# Patient Record
Sex: Female | Born: 1970 | Race: White | Hispanic: No | Marital: Single | Smoking: Never smoker
Health system: Southern US, Community
[De-identification: ages and names within clinical notes are randomized; demographics above are authoritative.]

## PROBLEM LIST (undated history)

## (undated) DIAGNOSIS — K589 Irritable bowel syndrome without diarrhea: Secondary | ICD-10-CM

## (undated) DIAGNOSIS — G4700 Insomnia, unspecified: Secondary | ICD-10-CM

## (undated) DIAGNOSIS — I10 Essential (primary) hypertension: Secondary | ICD-10-CM

## (undated) HISTORY — DX: Essential (primary) hypertension: I10

## (undated) HISTORY — DX: Insomnia, unspecified: G47.00

## (undated) HISTORY — PX: NASAL SEPTUM SURGERY: SHX37

---

## 1998-03-20 ENCOUNTER — Other Ambulatory Visit: Admission: RE | Admit: 1998-03-20 | Discharge: 1998-03-20 | Payer: Self-pay | Admitting: Obstetrics and Gynecology

## 1998-11-04 ENCOUNTER — Other Ambulatory Visit: Admission: RE | Admit: 1998-11-04 | Discharge: 1998-11-04 | Payer: Self-pay | Admitting: Obstetrics and Gynecology

## 1999-06-02 ENCOUNTER — Other Ambulatory Visit: Admission: RE | Admit: 1999-06-02 | Discharge: 1999-06-02 | Payer: Self-pay | Admitting: Obstetrics and Gynecology

## 2000-01-22 ENCOUNTER — Inpatient Hospital Stay (HOSPITAL_COMMUNITY): Admission: AD | Admit: 2000-01-22 | Discharge: 2000-01-25 | Payer: Self-pay | Admitting: Obstetrics and Gynecology

## 2000-03-09 ENCOUNTER — Other Ambulatory Visit: Admission: RE | Admit: 2000-03-09 | Discharge: 2000-03-09 | Payer: Self-pay | Admitting: Obstetrics and Gynecology

## 2002-04-12 ENCOUNTER — Other Ambulatory Visit: Admission: RE | Admit: 2002-04-12 | Discharge: 2002-04-12 | Payer: Self-pay | Admitting: Obstetrics and Gynecology

## 2004-04-09 ENCOUNTER — Other Ambulatory Visit: Admission: RE | Admit: 2004-04-09 | Discharge: 2004-04-09 | Payer: Self-pay | Admitting: Obstetrics and Gynecology

## 2005-05-17 ENCOUNTER — Other Ambulatory Visit: Admission: RE | Admit: 2005-05-17 | Discharge: 2005-05-17 | Payer: Self-pay | Admitting: Obstetrics and Gynecology

## 2005-06-03 ENCOUNTER — Encounter: Admission: RE | Admit: 2005-06-03 | Discharge: 2005-06-03 | Payer: Self-pay | Admitting: Obstetrics and Gynecology

## 2005-06-14 ENCOUNTER — Encounter: Admission: RE | Admit: 2005-06-14 | Discharge: 2005-06-14 | Payer: Self-pay | Admitting: Obstetrics and Gynecology

## 2012-09-18 ENCOUNTER — Encounter: Payer: Self-pay | Admitting: Gastroenterology

## 2012-09-18 ENCOUNTER — Other Ambulatory Visit (HOSPITAL_COMMUNITY): Payer: Self-pay | Admitting: Obstetrics and Gynecology

## 2012-09-18 DIAGNOSIS — K5900 Constipation, unspecified: Secondary | ICD-10-CM

## 2012-09-18 DIAGNOSIS — R1032 Left lower quadrant pain: Secondary | ICD-10-CM

## 2012-09-19 ENCOUNTER — Ambulatory Visit (HOSPITAL_COMMUNITY)
Admission: RE | Admit: 2012-09-19 | Discharge: 2012-09-19 | Disposition: A | Discharge status: Home / Self Care | Payer: Managed Care, Other (non HMO) | Source: Ambulatory Visit | Attending: Obstetrics and Gynecology | Admitting: Obstetrics and Gynecology

## 2012-09-19 DIAGNOSIS — R1032 Left lower quadrant pain: Secondary | ICD-10-CM | POA: Insufficient documentation

## 2012-09-19 DIAGNOSIS — N9489 Other specified conditions associated with female genital organs and menstrual cycle: Secondary | ICD-10-CM | POA: Insufficient documentation

## 2012-09-19 DIAGNOSIS — K5900 Constipation, unspecified: Secondary | ICD-10-CM | POA: Insufficient documentation

## 2012-09-19 MED ORDER — IOHEXOL 300 MG/ML  SOLN
100.0000 mL | Freq: Once | INTRAMUSCULAR | Status: AC | PRN
  Administered 2012-09-19: 100 mL via INTRAVENOUS

## 2012-09-21 ENCOUNTER — Other Ambulatory Visit: Payer: Self-pay | Admitting: Obstetrics and Gynecology

## 2012-09-21 DIAGNOSIS — N858 Other specified noninflammatory disorders of uterus: Secondary | ICD-10-CM

## 2012-09-26 ENCOUNTER — Ambulatory Visit
Admission: RE | Admit: 2012-09-26 | Discharge: 2012-09-26 | Disposition: A | Discharge status: Home / Self Care | Payer: Managed Care, Other (non HMO) | Source: Ambulatory Visit | Attending: Obstetrics and Gynecology | Admitting: Obstetrics and Gynecology

## 2012-09-26 DIAGNOSIS — N858 Other specified noninflammatory disorders of uterus: Secondary | ICD-10-CM

## 2012-09-26 MED ORDER — GADOBENATE DIMEGLUMINE 529 MG/ML IV SOLN
12.0000 mL | Freq: Once | INTRAVENOUS | Status: AC | PRN
  Administered 2012-09-26: 12 mL via INTRAVENOUS

## 2012-10-11 DIAGNOSIS — Q644 Malformation of urachus: Secondary | ICD-10-CM | POA: Insufficient documentation

## 2012-10-11 DIAGNOSIS — R109 Unspecified abdominal pain: Secondary | ICD-10-CM | POA: Insufficient documentation

## 2012-10-13 ENCOUNTER — Ambulatory Visit: Payer: Self-pay | Admitting: Gastroenterology

## 2013-05-28 ENCOUNTER — Encounter: Payer: Self-pay | Admitting: *Deleted

## 2015-07-10 ENCOUNTER — Ambulatory Visit (INDEPENDENT_AMBULATORY_CARE_PROVIDER_SITE_OTHER): Payer: 59

## 2015-07-10 ENCOUNTER — Ambulatory Visit (INDEPENDENT_AMBULATORY_CARE_PROVIDER_SITE_OTHER): Payer: 59 | Admitting: Podiatry

## 2015-07-10 DIAGNOSIS — M779 Enthesopathy, unspecified: Secondary | ICD-10-CM | POA: Diagnosis not present

## 2015-07-10 DIAGNOSIS — S93602A Unspecified sprain of left foot, initial encounter: Secondary | ICD-10-CM

## 2015-07-10 DIAGNOSIS — M79672 Pain in left foot: Secondary | ICD-10-CM | POA: Diagnosis not present

## 2015-07-10 NOTE — Progress Notes (Signed)
   Subjective:    Patient ID: Katrina Molina, female    DOB: 10/10/1970, 45 y.o.   MRN: [id number]  HPI: She presents today with a chief complaint of a injured foot proximally one month ago where she stepped in a hole and fell forward with the foot plantar flexed. She states that she was walking her dog and fell forward on the foot noticing immediate soreness. She states that she has tried rubbing it and massaging it soaking it and icing it and heat and nothing really seems to help she states that over the last few days it has started to fill little better but is still painful and she has a very odd sensation is her between her toes of her left foot.    Review of Systems  All other systems reviewed and are negative.      Objective:   Physical Exam: 45 year old female vital signs stable alert and oriented 3. Pulses are strongly palpable. Neurologic sensorium is intact that she does have some allodynia and some paresthesias of the deep peroneal nerve as it courses across the dorsal aspect of the foot. Deep tendon reflexes are intact muscle strength was 5 over 5 dorsiflexion plantar flexors and inverters everters on physical musculature is intact. Orthopedic evaluation of his wrist all joints distal to the angle full range of motion without crepitation. She has pain on full plane range of motion and separation of the first and second metatarsals. This radiates from dorsal to the plantar arch. Radiographs taken today in the stratus slight diastases between the first and second metatarsals at the bases on possibly indicating a Lisfranc's injury possibly a sprain I don't see a fracture fragment or fleck fracture.       Assessment & Plan:   foot sprain with neuritis dorsal aspect right.  Plan: Discussed etiology pathology conservative versus surgical therapies. At this point I instructed her to take over-the-counter medications such Aleve twice daily for the next 2 weeks on a regular basis. And  to wear good supportive shoes. Also explained to her that this can take as long as 6 months before it feels well.

## 2015-11-29 ENCOUNTER — Ambulatory Visit (HOSPITAL_COMMUNITY)
Admission: EM | Admit: 2015-11-29 | Discharge: 2015-11-29 | Disposition: A | Discharge status: Home / Self Care | Payer: Managed Care, Other (non HMO)

## 2015-11-29 ENCOUNTER — Encounter (HOSPITAL_COMMUNITY): Payer: Self-pay | Admitting: Emergency Medicine

## 2015-11-29 ENCOUNTER — Emergency Department (HOSPITAL_COMMUNITY)
Admission: EM | Admit: 2015-11-29 | Discharge: 2015-11-29 | Disposition: A | Discharge status: Home / Self Care | Payer: 59 | Attending: Emergency Medicine | Admitting: Emergency Medicine

## 2015-11-29 DIAGNOSIS — Z79899 Other long term (current) drug therapy: Secondary | ICD-10-CM | POA: Diagnosis not present

## 2015-11-29 DIAGNOSIS — Y9289 Other specified places as the place of occurrence of the external cause: Secondary | ICD-10-CM | POA: Diagnosis not present

## 2015-11-29 DIAGNOSIS — Y999 Unspecified external cause status: Secondary | ICD-10-CM | POA: Diagnosis not present

## 2015-11-29 DIAGNOSIS — Y93G3 Activity, cooking and baking: Secondary | ICD-10-CM | POA: Insufficient documentation

## 2015-11-29 DIAGNOSIS — S71151A Open bite, right thigh, initial encounter: Secondary | ICD-10-CM

## 2015-11-29 DIAGNOSIS — W5581XA Bitten by other mammals, initial encounter: Secondary | ICD-10-CM | POA: Insufficient documentation

## 2015-11-29 MED ORDER — RABIES VACCINE, PCEC IM SUSR
1.0000 mL | Freq: Once | INTRAMUSCULAR | Status: AC
  Administered 2015-11-29: 1 mL via INTRAMUSCULAR
  Filled 2015-11-29: qty 1

## 2015-11-29 MED ORDER — RABIES IMMUNE GLOBULIN 150 UNIT/ML IM INJ
20.0000 [IU]/kg | INJECTION | Freq: Once | INTRAMUSCULAR | Status: AC
  Administered 2015-11-29: 1350 [IU] via INTRAMUSCULAR
  Filled 2015-11-29: qty 9

## 2015-11-29 NOTE — ED Notes (Signed)
Pt. Stated, I was outside last night and I felt like I got bit by a bat , I didn't see it but Im pretty sure it was a bat . 2 bites on the rt. Side of hip.

## 2015-11-29 NOTE — ED Provider Notes (Signed)
CSN: [id number]     Arrival date & time 11/29/15  1404 History   First MD Initiated Contact with Patient 11/29/15 1757     Chief Complaint  Patient presents with  . Animal Bite    ? bat bite     (Consider location/radiation/quality/duration/timing/severity/associated sxs/prior Treatment) HPI  Pt presenting with c/o bat bite.  Pt states she was outside cooking dinner this evening prior to arrival and there was a bat flying near her head.  She went inside, came outside later to water flowers and felt a bite on her right thigh.  She saw 4 Dinh puncture marks and minimal bleeding at the site  She denies other symptoms.  Bleeding is controlled.  She has not had any other treatment prior to arrival.  There are no other associated systemic symptoms, there are no other alleviating or modifying factors.   Past Medical History  Diagnosis Date  . Insomnia    Past Surgical History  Procedure Laterality Date  . Cesarean section  2001   Family History  Problem Relation Age of Onset  . Hypertension Mother    Social History  Substance Use Topics  . Smoking status: Never Smoker   . Smokeless tobacco: None  . Alcohol Use: Yes   OB History    No data available     Review of Systems  ROS reviewed and all otherwise negative except for mentioned in HPI    Allergies  Aspirin; Ibuprofen; and Penicillins  Home Medications   Prior to Admission medications   Medication Sig Start Date End Date Taking? Authorizing Provider  escitalopram (LEXAPRO) 10 MG tablet Take 10 mg by mouth daily. 06/29/15   Historical Provider, MD  norethindrone-ethinyl estradiol 1/35 (ORTHO-NOVUM 1/35, 28,) tablet Take 1 tablet by mouth daily.    Historical Provider, MD  NORTREL 7/7/7 0.5/0.75/1-35 MG-MCG tablet Take 1 tablet by mouth daily. 06/27/15   Historical Provider, MD  zolpidem (AMBIEN) 10 MG tablet  07/09/15   Historical Provider, MD   BP 184/96 mmHg  Pulse 73  Temp(Src) 97.6 F (36.4 C) (Oral)  Resp 18  Wt  65.772 kg  SpO2 99%  LMP 11/22/2015  Vitals reviewed Physical Exam  Physical Examination: General appearance - alert, well appearing, and in no distress Mental status - alert, oriented to person, place, and time Eyes - no scleral icterus, no conjunctival injection no scleral icterus Chest - clear to auscultation, no wheezes, rales or rhonchi, symmetric air entry Neurological - alert, oriented, normal speech Musculoskeletal - no joint tenderness, deformity or swelling Extremities - peripheral pulses normal, no pedal edema, no clubbing or cyanosis Skin - normal coloration and turgor, no rashes except 4 tiny punctate lesions on right thigh, no active bleeding  ED Course  Procedures (including critical care time) Labs Review Labs Reviewed - No data to display  Imaging Review No results found. I have personally reviewed and evaluated these images and lab results as part of my medical decision-making.   EKG Interpretation None      MDM   Final diagnoses:  Bite of thigh, right, initial encounter    Pt presenting with c/o probable bat bite on right thigh.  Pt administered rabies IG at site of wound and IM, also given rabies vaccine- given information about schedule for completing rabies vaccine.  Discharged with strict return precautions.  Pt agreeable with plan.    Jerelyn Scott, MD 11/29/15 2222

## 2015-11-29 NOTE — Discharge Instructions (Signed)
Return to the ED with any concerns including fever, weakness, seizures, decreased level of alertness/lethargy, or any other alarming symptoms  You should return for the rabies vaccinations as noted on the discharge paperwork

## 2015-12-02 ENCOUNTER — Ambulatory Visit (HOSPITAL_COMMUNITY)
Admission: EM | Admit: 2015-12-02 | Discharge: 2015-12-02 | Disposition: A | Discharge status: Home / Self Care | Payer: 59 | Attending: Internal Medicine | Admitting: Internal Medicine

## 2015-12-02 ENCOUNTER — Encounter (HOSPITAL_COMMUNITY): Payer: Self-pay | Admitting: *Deleted

## 2015-12-02 DIAGNOSIS — Z203 Contact with and (suspected) exposure to rabies: Secondary | ICD-10-CM

## 2015-12-02 MED ORDER — RABIES VACCINE, PCEC IM SUSR
1.0000 mL | Freq: Once | INTRAMUSCULAR | Status: AC
  Administered 2015-12-02: 1 mL via INTRAMUSCULAR

## 2015-12-02 MED ORDER — RABIES VACCINE, PCEC IM SUSR
INTRAMUSCULAR | Status: AC
  Filled 2015-12-02: qty 1

## 2015-12-02 NOTE — ED Triage Notes (Signed)
Pt   Advised  She  Could  Be  Seen today  By  The  Provider   About    Her  bp    She  Declined  And  Promised   She  Would  See  A  pcp

## 2015-12-02 NOTE — Discharge Instructions (Signed)
Followup  As   Directed    And  Return as  Directed  for  The  Next  Injection     Please  Have  Your  bp   followup    As   Directed

## 2015-12-06 ENCOUNTER — Encounter (HOSPITAL_COMMUNITY): Payer: Self-pay | Admitting: Emergency Medicine

## 2015-12-06 ENCOUNTER — Ambulatory Visit (HOSPITAL_COMMUNITY)
Admission: EM | Admit: 2015-12-06 | Discharge: 2015-12-06 | Disposition: A | Discharge status: Home / Self Care | Payer: 59 | Attending: Family Medicine | Admitting: Family Medicine

## 2015-12-06 DIAGNOSIS — Z203 Contact with and (suspected) exposure to rabies: Secondary | ICD-10-CM | POA: Diagnosis not present

## 2015-12-06 MED ORDER — RABIES VACCINE, PCEC IM SUSR
1.0000 mL | Freq: Once | INTRAMUSCULAR | Status: AC
  Administered 2015-12-06: 1 mL via INTRAMUSCULAR

## 2015-12-06 MED ORDER — RABIES VACCINE, PCEC IM SUSR
INTRAMUSCULAR | Status: AC
  Filled 2015-12-06: qty 1

## 2015-12-06 NOTE — Discharge Instructions (Signed)
Return as scheduled.  Return sooner if any concerns

## 2015-12-06 NOTE — ED Triage Notes (Signed)
Patient had contact with a bat.  Day zero was 7/22.  Patient here today for Day seven in rabies series.

## 2015-12-13 ENCOUNTER — Ambulatory Visit (HOSPITAL_COMMUNITY)
Admission: EM | Admit: 2015-12-13 | Discharge: 2015-12-13 | Disposition: A | Discharge status: Home / Self Care | Payer: 59 | Attending: Physician Assistant | Admitting: Physician Assistant

## 2015-12-13 ENCOUNTER — Encounter (HOSPITAL_COMMUNITY): Payer: Self-pay | Admitting: Emergency Medicine

## 2015-12-13 DIAGNOSIS — Z23 Encounter for immunization: Secondary | ICD-10-CM | POA: Diagnosis not present

## 2015-12-13 DIAGNOSIS — Z203 Contact with and (suspected) exposure to rabies: Secondary | ICD-10-CM | POA: Diagnosis not present

## 2015-12-13 MED ORDER — RABIES VACCINE, PCEC IM SUSR
INTRAMUSCULAR | Status: AC
  Filled 2015-12-13: qty 1

## 2015-12-13 MED ORDER — RABIES VACCINE, PCEC IM SUSR
1.0000 mL | Freq: Once | INTRAMUSCULAR | Status: AC
  Administered 2015-12-13: 1 mL via INTRAMUSCULAR

## 2015-12-13 NOTE — Discharge Instructions (Signed)
Today was your final injection of the rabies series. Please return to the Urgent Care Center for any further problems with the vaccinations.

## 2015-12-13 NOTE — ED Triage Notes (Signed)
The patient presented to the [facility] for the final injection in the rabies series that was started at the [facility] ED on 11/29/2015.

## 2016-06-15 ENCOUNTER — Encounter: Payer: Self-pay | Admitting: Physician Assistant

## 2016-06-29 ENCOUNTER — Encounter (INDEPENDENT_AMBULATORY_CARE_PROVIDER_SITE_OTHER): Payer: Self-pay

## 2016-06-29 ENCOUNTER — Encounter: Payer: Self-pay | Admitting: Nurse Practitioner

## 2016-06-29 ENCOUNTER — Ambulatory Visit (INDEPENDENT_AMBULATORY_CARE_PROVIDER_SITE_OTHER): Payer: PRIVATE HEALTH INSURANCE | Admitting: Nurse Practitioner

## 2016-06-29 VITALS — BP 170/100 | HR 80 | Ht 60.0 in | Wt 160.0 lb

## 2016-06-29 DIAGNOSIS — R1032 Left lower quadrant pain: Secondary | ICD-10-CM | POA: Diagnosis not present

## 2016-06-29 DIAGNOSIS — K625 Hemorrhage of anus and rectum: Secondary | ICD-10-CM

## 2016-06-29 MED ORDER — HYOSCYAMINE SULFATE 0.125 MG SL SUBL: 0.1250 mg | SUBLINGUAL_TABLET | Freq: Three times a day (TID) | SUBLINGUAL | 0 refills | Status: DC | PRN

## 2016-06-29 NOTE — Progress Notes (Signed)
HPI:  Patient is a 46 year old female, self-referred, for evaluation of LLQ pain which began in 2014. 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 A follow-up pelvic MRI suggested the same. Patient was referred to urology, she actually saw 2 different urologists and workup was apparently negative. Patient was advised to see GI for evaluation of her abdominal pain. The pain eventually resolved so patient halted her workup. She did however see her GYN and states the evaluation was negative. Though no significant pain she was frequently aware of something bothersome in LLQ when bending, lifting LLE, having intercourse. Several months ago she actually began have episodes of pain in the area which couldn't be related to aforementioned activities. Eating does not consistently cause the pain. Often feels if she could have a bowel movement or pass gas the pain would subside. Pain sometimes radiates through to mid lower back. Heating pad helps the pain.Marland Kitchen. No change in her bowel movements, typically has several bowel movements a day of various consistencies. She has occasional rectal bleeding during times of excessive loose stools.  Past Medical History:  Diagnosis Date  . Insomnia     Past Surgical History:  Procedure Laterality Date  . CESAREAN SECTION  2001   Family History  Problem Relation Age of Onset  . Hypertension Mother   . Heart disease Mother   . Irritable bowel syndrome Mother   . Colon cancer Neg Hx    Social History  Substance Use Topics  . Smoking status: Never Smoker  . Smokeless tobacco: Never Used  . Alcohol use Yes     Comment: 1-2 drinks daily   Current Outpatient Prescriptions  Medication Sig Dispense Refill  . escitalopram (LEXAPRO) 10 MG tablet Take 10 mg by mouth daily.  11  . norethindrone-ethinyl estradiol 1/35 (ORTHO-NOVUM 1/35, 28,) tablet Take 1  tablet by mouth daily.    Marland Kitchen. NORTREL 7/7/7 0.5/0.75/1-35 MG-MCG tablet Take 1 tablet by mouth daily.  1  . zolpidem (AMBIEN) 10 MG tablet      No current facility-administered medications for this visit.    Allergies  Allergen Reactions  . Aspirin   . Ibuprofen   . Penicillins Hives    Review of Systems: All systems reviewed and negative except where noted in HPI.   Physical Exam: BP (!) 170/100   Pulse 80   Ht 5' (1.524 m)   Wt 160 lb (72.6 kg)   BMI 31.25 kg/m  Constitutional:  Well-developed, white female in no acute distress. Psychiatric: Pleasant , normal mood and affect. Behavior is normal. HEENT: Normocephalic and atraumatic. Conjunctivae are normal. No scleral icterus. Neck supple.  Cardiovascular: Normal rate, regular rhythm.  Pulmonary/chest: Effort normal and breath sounds normal. No wheezing, rales or rhonchi. Abdominal: Soft, nondistended, nontender. Bowel sounds active throughout. There are no masses palpable. No hepatomegaly.Negative Carnett's Rectal: no masses felt on DRE Extremities: no edema Lymphadenopathy: No cervical adenopathy noted. Neurological: Alert and oriented to person place and time. Skin: Skin is warm and dry. No rashes noted.   ASSESSMENT AND PLAN:  311. 46 year old female with chronic, mild , intermittent LLQ discomfort consistently associated with activities such as bending, lifting left leg , and sexual intercourse. Pain can radiate through to lower back. Sometimes she has episodes of unprovoked significant pain in the same area. These episodes are transient and she can't reliably relate them to  anything, including eating and defecation.  Patient has several BMs a day of various consistencies but that is her baseline. -She could have musculoskeletal pain. MRI of pelvis for same pain in 2014 showed L5-S1 posterior disc bulge which may be clinically irrelevant to her pain but worth keeping in mind.    -IBS? Trial of bowel antispasmodic prn. If no  benefit she will discontinue -Patient will call with condition update and decision on a colonoscopy in next 3 weeks  2. Occasional scant rectal bleeding during times of excessive loose stools. Most likely perianal source of bleeding but still recommend colonoscopy to exclude other etiologies. She will think about it, not particularly interested right now.    3. Elevated BP - 170/100 in office today  Willette Cluster, NP  06/29/2016, 2:48 PM

## 2016-06-29 NOTE — Patient Instructions (Addendum)
If you are age 45 or older, your body mass index should be between 23-30. Your Body mass index is 31.25 kg/m. If this is out of the aforementioned range listed, please consider follow up with your Primary Care Provider.  If you are age 45 or younger, your body mass index should be between 19-25. Your Body mass index is 31.25 kg/m. If this is out of the aformentioned range listed, please consider follow up with your Primary Care Provider.   We have sent the following medications to your pharmacy for you to pick up at your convenience: Levsin   Call the office in 3-4 weeks with an update. [phone] ask for Beth  Thank you for choosing [facility]

## 2016-07-05 NOTE — Progress Notes (Signed)
Reviewed and agree with documentation and assessment and plan. K. Veena Shlok Raz , MD   

## 2017-02-24 ENCOUNTER — Other Ambulatory Visit: Payer: Self-pay | Admitting: Obstetrics and Gynecology

## 2017-02-24 DIAGNOSIS — N63 Unspecified lump in unspecified breast: Secondary | ICD-10-CM

## 2017-03-14 ENCOUNTER — Other Ambulatory Visit: Payer: 59

## 2017-04-05 ENCOUNTER — Ambulatory Visit
Admission: RE | Admit: 2017-04-05 | Discharge: 2017-04-05 | Disposition: A | Discharge status: Home / Self Care | Payer: PRIVATE HEALTH INSURANCE | Source: Ambulatory Visit | Attending: Obstetrics and Gynecology | Admitting: Obstetrics and Gynecology

## 2017-04-05 DIAGNOSIS — N63 Unspecified lump in unspecified breast: Secondary | ICD-10-CM

## 2018-05-18 ENCOUNTER — Telehealth: Payer: Self-pay | Admitting: Family Medicine

## 2018-05-18 NOTE — Telephone Encounter (Signed)
Patient not seen in office since 08/04/2011 and is no longer our patient and would like to know if you will take her back

## 2018-05-18 NOTE — Telephone Encounter (Signed)
Pt states she was seen at urgent care x 1 month ago for cough and sore throat, pt states her throat feels better but cant seem to subside her cough. Advise.   Patient has not been seen here 08/04/2011. Advise Pt has [employer].

## 2018-05-19 NOTE — Telephone Encounter (Signed)
Sorry not taking on new patients currently

## 2018-05-19 NOTE — Telephone Encounter (Signed)
Left message on answering service to let patient know currently at this time not taking on new patient.

## 2018-06-05 ENCOUNTER — Other Ambulatory Visit: Payer: Self-pay | Admitting: Obstetrics and Gynecology

## 2018-06-05 DIAGNOSIS — R928 Other abnormal and inconclusive findings on diagnostic imaging of breast: Secondary | ICD-10-CM

## 2018-06-09 ENCOUNTER — Ambulatory Visit
Admission: RE | Admit: 2018-06-09 | Discharge: 2018-06-09 | Disposition: A | Discharge status: Home / Self Care | Payer: PRIVATE HEALTH INSURANCE | Source: Ambulatory Visit | Attending: Obstetrics and Gynecology | Admitting: Obstetrics and Gynecology

## 2018-06-09 ENCOUNTER — Ambulatory Visit: Payer: PRIVATE HEALTH INSURANCE

## 2018-06-09 DIAGNOSIS — R928 Other abnormal and inconclusive findings on diagnostic imaging of breast: Secondary | ICD-10-CM

## 2018-11-23 ENCOUNTER — Other Ambulatory Visit: Payer: Self-pay

## 2018-11-23 ENCOUNTER — Encounter (HOSPITAL_COMMUNITY): Payer: Self-pay | Admitting: Emergency Medicine

## 2018-11-23 ENCOUNTER — Emergency Department (HOSPITAL_COMMUNITY)
Admission: EM | Admit: 2018-11-23 | Discharge: 2018-11-23 | Disposition: A | Discharge status: Home / Self Care | Payer: PRIVATE HEALTH INSURANCE | Attending: Emergency Medicine | Admitting: Emergency Medicine

## 2018-11-23 DIAGNOSIS — R040 Epistaxis: Secondary | ICD-10-CM | POA: Insufficient documentation

## 2018-11-23 DIAGNOSIS — Z79899 Other long term (current) drug therapy: Secondary | ICD-10-CM | POA: Insufficient documentation

## 2018-11-23 DIAGNOSIS — I10 Essential (primary) hypertension: Secondary | ICD-10-CM | POA: Insufficient documentation

## 2018-11-23 MED ORDER — AMLODIPINE BESYLATE 5 MG PO TABS
2.5000 mg | ORAL_TABLET | Freq: Every day | ORAL | Status: DC
  Administered 2018-11-23: 07:00:00 2.5 mg via ORAL
  Filled 2018-11-23: qty 1

## 2018-11-23 MED ORDER — AMLODIPINE BESYLATE 2.5 MG PO TABS: 2.5000 mg | ORAL_TABLET | Freq: Every day | ORAL | 3 refills | Status: AC

## 2018-11-23 MED ORDER — PHENYLEPHRINE HCL 0.5 % NA SOLN
1.0000 [drp] | Freq: Once | NASAL | Status: AC
  Administered 2018-11-23: 1 [drp] via NASAL
  Filled 2018-11-23: qty 15

## 2018-11-23 MED ORDER — COCAINE HCL 4 % EX SOLN
4.0000 mL | Freq: Once | CUTANEOUS | Status: AC
  Administered 2018-11-23: 03:00:00 4 mL via NASAL
  Filled 2018-11-23: qty 4

## 2018-11-23 MED ORDER — CLONIDINE HCL 0.2 MG PO TABS
0.2000 mg | ORAL_TABLET | Freq: Once | ORAL | Status: AC
  Administered 2018-11-23: 0.2 mg via ORAL
  Filled 2018-11-23: qty 1

## 2018-11-23 NOTE — ED Provider Notes (Signed)
[facility] EMERGENCY DEPARTMENT Provider Note   CSN: [id number] Arrival date & time: 11/23/18  0251    History   Chief Complaint Chief Complaint  Patient presents with  . Epistaxis    HPI Katrina Molina is a 48 y.o. female.     Patient presents to the emergency department for evaluation of nosebleed.  Patient reports that her nose has been bleeding on and off all day.  She reports that it will bleed for 20 or 30 minutes and then stop.  She denies any direct injury.  She has not had any headache, vision change.  She denies cold symptoms.  She has never had similar problems in the past.     Past Medical History:  Diagnosis Date  . Insomnia     Patient Active Problem List   Diagnosis Date Noted  . Abdominal pain 10/11/2012  . 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 CESAREAN SECTION  2001     OB History   No obstetric history on file.      Home Medications    Prior to Admission medications   Medication Sig Start Date End Date Taking? Authorizing Provider  amLODipine (NORVASC) 2.5 MG tablet Take 1 tablet (2.5 mg total) by mouth daily. 11/23/18   Orpah Greek, MD  escitalopram (LEXAPRO) 10 MG tablet Take 10 mg by mouth daily. 06/29/15   [provider]  hyoscyamine (LEVSIN SL) 0.125 MG SL tablet Place 1 tablet (0.125 mg total) under the tongue every 8 (eight) hours as needed. 06/29/16   Willia Craze, NP  norethindrone-ethinyl estradiol 1/35 ([place] 1/35, 28,) tablet Take 1 tablet by mouth daily.    [provider]  NORTREL 7/7/7 0.5/0.75/1-35 MG-MCG tablet Take 1 tablet by mouth daily. 06/27/15   [provider]  zolpidem (AMBIEN) 10 MG tablet  07/09/15   [provider]    Family History Family History  Problem Relation Age of Onset  . Hypertension Mother   . Heart disease Mother   . Irritable bowel syndrome Mother   . Colon cancer Neg Hx    . Breast cancer Neg Hx     Social History Social History   Tobacco Use  . Smoking status: Never Smoker  . Smokeless tobacco: Never Used  Substance Use Topics  . Alcohol use: Yes    Comment: 1-2 drinks daily  . Drug use: No     Allergies   Aspirin, Ibuprofen, and Penicillins   Review of Systems Review of Systems  HENT: Positive for nosebleeds.   All other systems reviewed and are negative.    Physical Exam Updated Vital Signs BP (!) 167/99   Pulse 79   Temp 98 F (36.7 C) (Oral)   Resp 18   Ht 5' (1.524 m)   Wt 65.8 kg   LMP 11/23/2018 (Exact Date)   SpO2 100%   BMI 28.32 kg/m   Physical Exam Vitals signs and nursing note reviewed.  Constitutional:      General: She is not in acute distress.    Appearance: Normal appearance. She is well-developed.  HENT:     Head: Normocephalic and atraumatic.     Right Ear: Hearing normal.     Left Ear: Hearing normal.     Nose:     Left Nostril: Epistaxis present.  Eyes:     Conjunctiva/sclera: Conjunctivae normal.     Pupils: Pupils are equal, round, and reactive  to light.  Neck:     Musculoskeletal: Normal range of motion and neck supple.  Cardiovascular:     Rate and Rhythm: Regular rhythm.     Heart sounds: S1 normal and S2 normal. No murmur. No friction rub. No gallop.   Pulmonary:     Effort: Pulmonary effort is normal. No respiratory distress.     Breath sounds: Normal breath sounds.  Chest:     Chest wall: No tenderness.  Abdominal:     General: Bowel sounds are normal.     Palpations: Abdomen is soft.     Tenderness: There is no abdominal tenderness. There is no guarding or rebound. Negative signs include Murphy's sign and McBurney's sign.     Hernia: No hernia is present.  Musculoskeletal: Normal range of motion.  Skin:    General: Skin is warm and dry.     Findings: No rash.  Neurological:     Mental Status: She is alert and oriented to person, place, and time.     GCS: GCS eye subscore is 4.  GCS verbal subscore is 5. GCS motor subscore is 6.     Cranial Nerves: No cranial nerve deficit.     Sensory: No sensory deficit.     Coordination: Coordination normal.  Psychiatric:        Speech: Speech normal.        Behavior: Behavior normal.        Thought Content: Thought content normal.      ED Treatments / Results  Labs (all labs ordered are listed, but only abnormal results are displayed) Labs Reviewed - No data to display  EKG None  Radiology No results found.  Procedures Procedures (including critical care time)  Medications Ordered in ED Medications  phenylephrine (NEO-SYNEPHRINE) 0.5 % nasal solution 1 drop (1 drop Left Nare Given 11/23/18 0315)  cocaine 4 % topical solution 4 mL (4 mLs Nasal Given 11/23/18 0317)  cloNIDine (CATAPRES) tablet 0.2 mg (0.2 mg Oral Given 11/23/18 0346)     Initial Impression / Assessment and Plan / ED Course  I have reviewed the triage vital signs and the nursing notes.  Pertinent labs & imaging results that were available during my care of the patient were reviewed by me and considered in my medical decision making (see chart for details).       Patient presents to the emergency department for evaluation of nosebleed.  Patient reports that she has been bleeding on and off all day.  She has never had similar bleeding before.  She is not on any blood thinners.  She reports that the first thing she felt was blood going down the back of her throat.  Since then she has had periods of brisk bleeding where she had heavy bleeding out of the left side of the nose with some coming out of the right side.  Here in the ER she has had only had intermittent slight bleeding.  She has been monitored and evaluated multiple times and I cannot identify an anterior source of the bleeding.  Patient noted to be very hypertensive at arrival.  She reports that she generally has been on the high side when she has seen doctors in the past, but has never done  the follow-up in order to get on any medication or treatment for her high blood pressure.  She was given clonidine here with significant improvement of her blood pressure.  Since the blood pressure has come down, she has not  had any bleeding.  Will continue to monitor blood pressure and for additional bleeding.  Sign out to oncoming ER physician to follow.  If there is any further bleeding, then to fight source.  I suspect that this has been a posterior bleed and therefore will need packing if there is further bleeding.  Discharge with ENT follow-up as well as PCP follow-up for recheck of blood pressure.   Final Clinical Impressions(s) / ED Diagnoses   Final diagnoses:  Left-sided epistaxis  Essential hypertension    ED Discharge Orders         Ordered    amLODipine (NORVASC) 2.5 MG tablet  Daily     11/23/18 0618           Gilda CreasePollina, Kaydn Kumpf J, MD 11/23/18 [phone]

## 2018-11-23 NOTE — Discharge Instructions (Signed)
Please schedule follow-up with your doctor for next week to have your blood pressure rechecked.  Start the blood pressure medication right away.  If you have any further bleeding, return to the ER for repeat evaluation.

## 2018-11-23 NOTE — ED Notes (Signed)
Patient has not had any more active bleeding at this time.

## 2018-11-23 NOTE — ED Triage Notes (Signed)
Patient states that her nose bleed has been off and on since 11am yesterday. Patient states that it has bleed for as long as 30 minutes. Patient's nose is not actively bleeding at this time.

## 2018-12-12 IMAGING — MG 2D DIGITAL DIAGNOSTIC BILATERAL MAMMOGRAM WITH CAD AND ADJUNCT T
8 of 15 series · 8 of 35 positions shown · non-contrast
Comparison: 09/18/2015 and earlier

CLINICAL DATA: Palpable abnormality in the upper portion of the
right breast.

EXAM:
2D DIGITAL DIAGNOSTIC BILATERAL MAMMOGRAM WITH CAD AND ADJUNCT TOMO
ULTRASOUND RIGHT BREAST

[R MLO synth-2D]
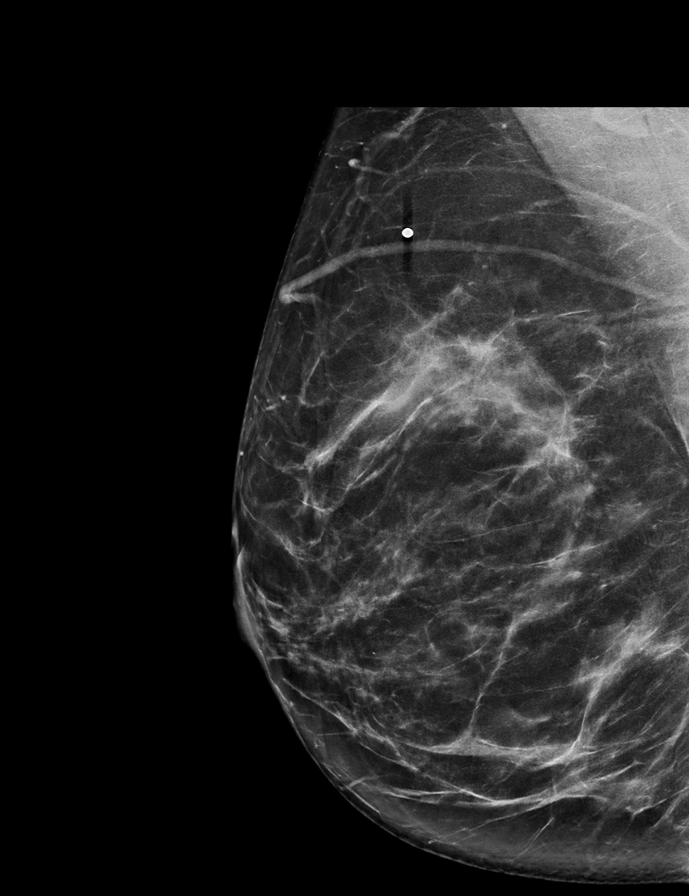

[L MLO]
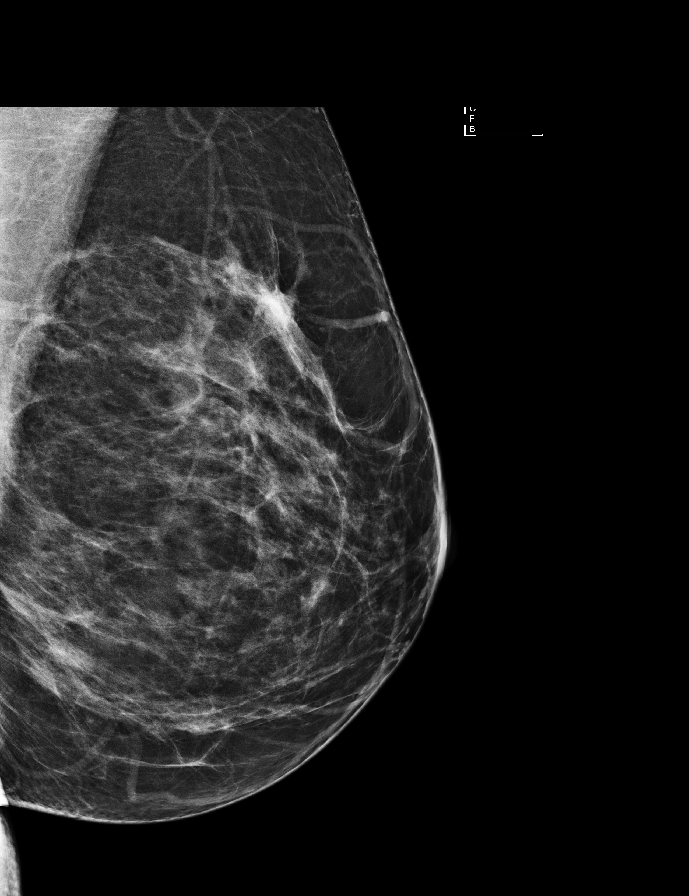

[L CC]
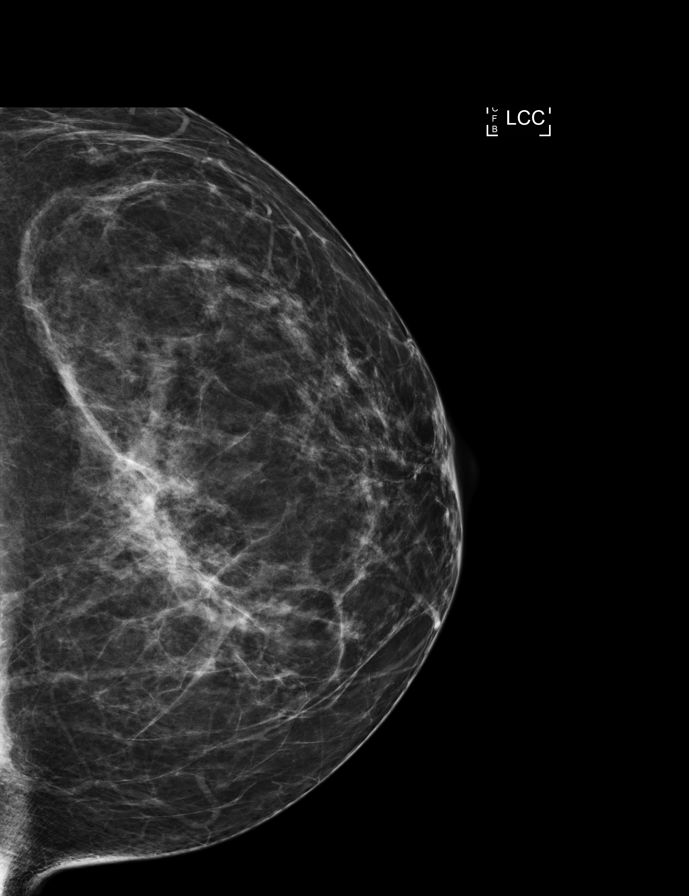

[L MLO synth-2D]
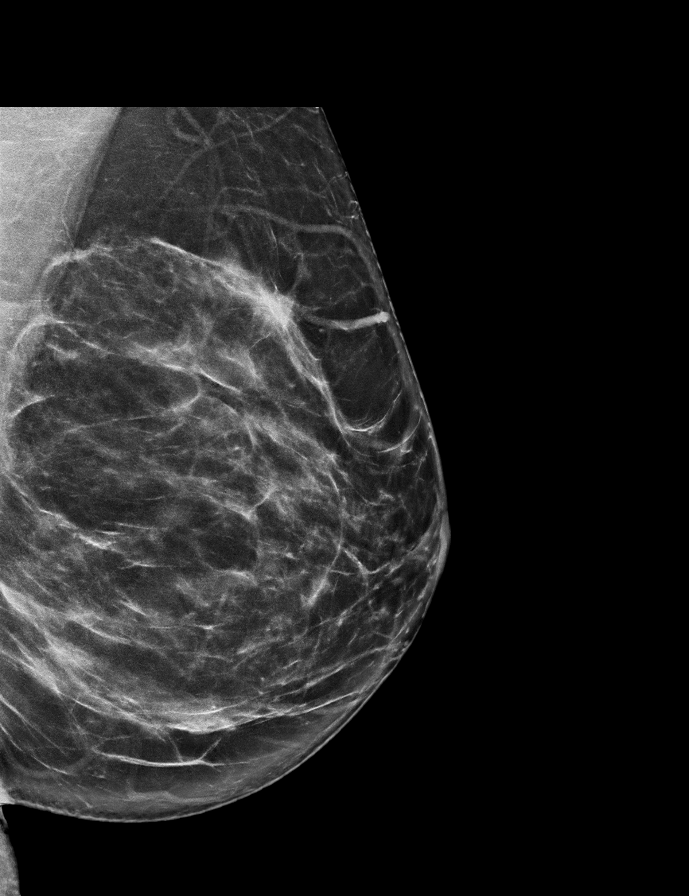

[R TAN synth-2D]
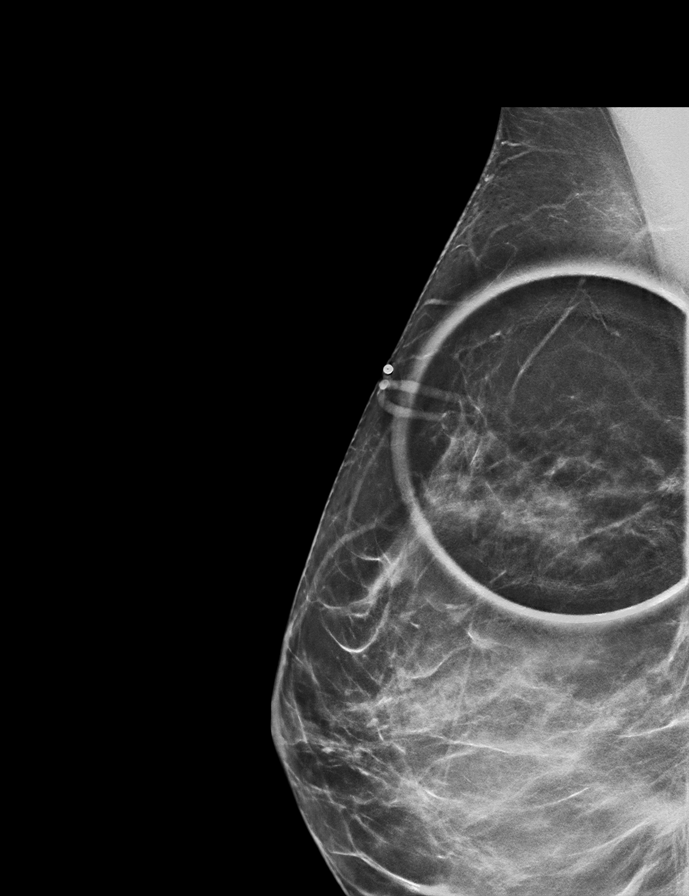

[R TAN]
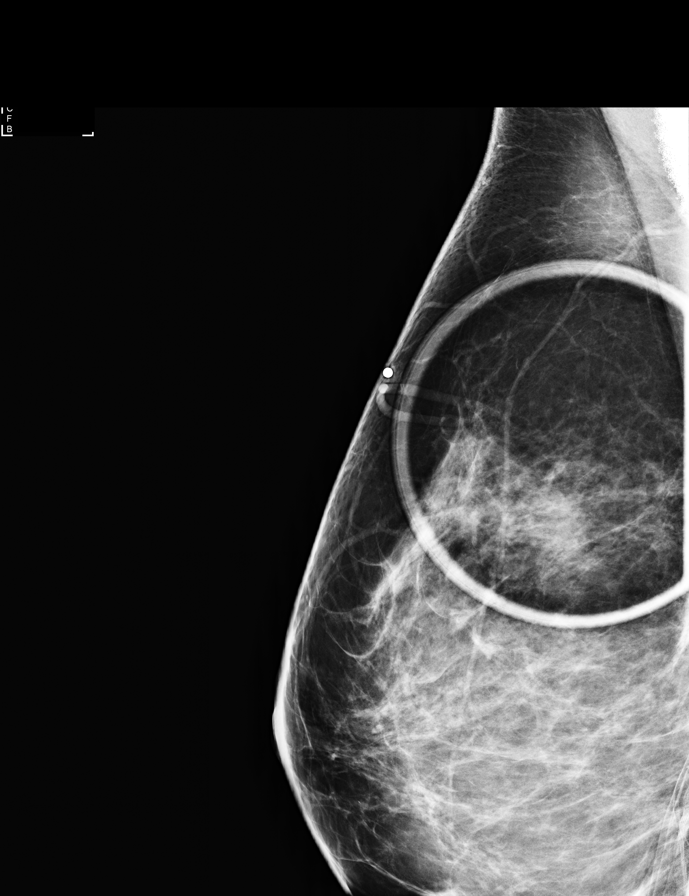

[R MLO]
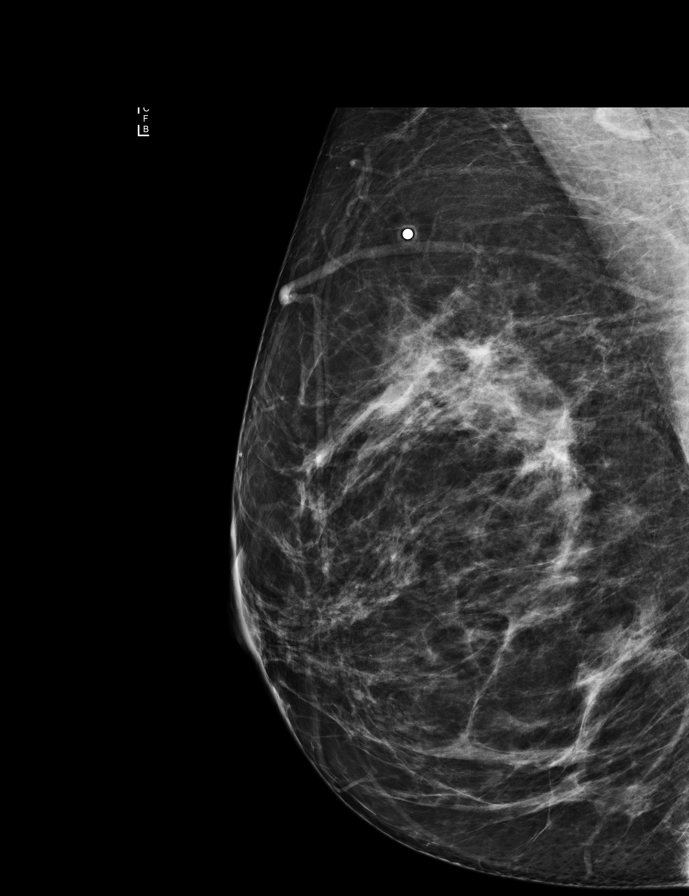

[L CC synth-2D]
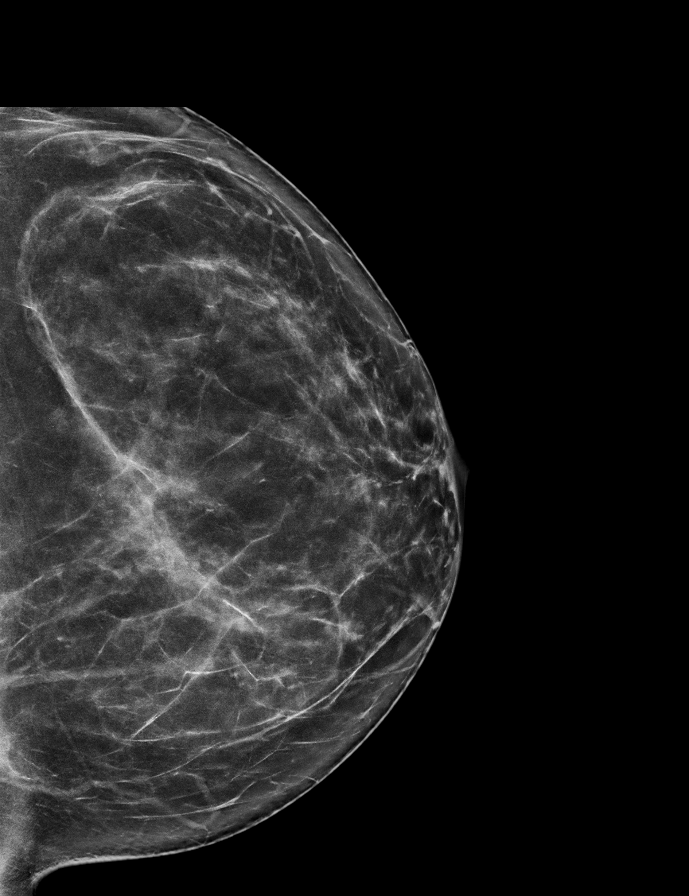

[8 of 35 positions shown; findings below may reference images not displayed]

ACR Breast Density Category b: There are scattered areas of
fibroglandular density.
FINDINGS: No suspicious mass, distortion, or microcalcifications are
identified to suggest presence of malignancy. Spot tangential view
in the area of patient's concern demonstrates normal appearing dense
fibroglandular tissue.

Mammographic images were processed with CAD.

On physical exam, I palpate a soft ridge of thickening across the
upper portion of the right breast, not associated with discrete
mass.

Targeted ultrasound is performed, showing normal appearing
fibroglandular tissue in the upper portion of the right breast. The
ridge of tissue in the 12 o'clock location of the right breast
corresponds well to the area of patient's concern. No mass,
distortion, or acoustic shadowing is demonstrated with ultrasound.
IMPRESSION: No mammographic or ultrasound evidence for malignancy.

RECOMMENDATION:
Screening mammogram in one year.(Code:8H-7-OU7)

I have discussed the findings and recommendations with the patient.
Results were also provided in writing at the conclusion of the
visit. If applicable, a reminder letter will be sent to the patient
regarding the next appointment.

BI-RADS CATEGORY  1: Negative.

## 2020-04-25 ENCOUNTER — Encounter (INDEPENDENT_AMBULATORY_CARE_PROVIDER_SITE_OTHER): Payer: Self-pay | Admitting: Otolaryngology

## 2020-04-25 ENCOUNTER — Other Ambulatory Visit: Payer: Self-pay

## 2020-04-25 ENCOUNTER — Ambulatory Visit (INDEPENDENT_AMBULATORY_CARE_PROVIDER_SITE_OTHER): Payer: 59 | Admitting: Otolaryngology

## 2020-04-25 VITALS — Temp 97.3°F

## 2020-04-25 DIAGNOSIS — J309 Allergic rhinitis, unspecified: Secondary | ICD-10-CM | POA: Diagnosis not present

## 2020-04-25 DIAGNOSIS — J342 Deviated nasal septum: Secondary | ICD-10-CM

## 2020-04-25 DIAGNOSIS — J310 Chronic rhinitis: Secondary | ICD-10-CM

## 2020-04-25 NOTE — Progress Notes (Signed)
HPI: Katrina Molina is a 49 y.o. female who presents for evaluation of right-sided nasal obstruction.  She feels like she might have polyps in her nose.  The right side of her nose always feels closed and she has had increasing right-sided nasal obstruction for several years.  She feels like something is in her nose and feels like she may have polyps in her nose.  She presents here to have her nose examined.  She does not smoke.  She breathes well through the left side of her nose.  Past Medical History:  Diagnosis Date  . Insomnia    Past Surgical History:  Procedure Laterality Date  . CESAREAN SECTION  2001   Social History   Socioeconomic History  . Marital status: Single    Spouse name: Not on file  . Number of children: Not on file  . Years of education: Not on file  . Highest education level: Not on file  Occupational History  . Not on file  Tobacco Use  . Smoking status: Never Smoker  . Smokeless tobacco: Never Used  Substance and Sexual Activity  . Alcohol use: Yes    Comment: 1-2 drinks daily  . Drug use: No  . Sexual activity: Not on file  Other Topics Concern  . Not on file  Social History Narrative  . Not on file   Social Determinants of Health   Financial Resource Strain: Not on file  Food Insecurity: Not on file  Transportation Needs: Not on file  Physical Activity: Not on file  Stress: Not on file  Social Connections: Not on file   Family History  Problem Relation Age of Onset  . Hypertension Mother   . Heart disease Mother   . Irritable bowel syndrome Mother   . Colon cancer Neg Hx   . Breast cancer Neg Hx    Allergies  Allergen Reactions  . Aspirin   . Ibuprofen   . Penicillins Hives   Prior to Admission medications   Medication Sig Start Date End Date Taking? Authorizing Provider  amLODipine (NORVASC) 2.5 MG tablet Take 1 tablet (2.5 mg total) by mouth daily. 11/23/18   Gilda Crease, MD  escitalopram (LEXAPRO) 10 MG tablet  Take 10 mg by mouth daily. 06/29/15   [provider]  hyoscyamine (LEVSIN SL) 0.125 MG SL tablet Place 1 tablet (0.125 mg total) under the tongue every 8 (eight) hours as needed. 06/29/16   Meredith Pel, NP  norethindrone-ethinyl estradiol 1/35 (ORTHO-NOVUM 1/35, 28,) tablet Take 1 tablet by mouth daily.    [provider]  NORTREL 7/7/7 0.5/0.75/1-35 MG-MCG tablet Take 1 tablet by mouth daily. 06/27/15   [provider]  zolpidem (AMBIEN) 10 MG tablet  07/09/15   [provider]     Positive ROS: Otherwise negative  All other systems have been reviewed and were otherwise negative with the exception of those mentioned in the HPI and as above.  Physical Exam: Constitutional: Alert, well-appearing, no acute distress Ears: External ears without lesions or tenderness. Ear canals are clear bilaterally with intact, clear TMs.  Nasal: External nose without lesions. Septum is deviated to the right..  On nasal endoscopy the left nasal cavity is clear.  The left middle meatus region is clear with a patent left maxillary ostia.  There is no edema.  The nasal cavity is otherwise clear.  However on the right side the right middle meatus region is clear but she has a severe deviation of  the right septum posteriorly.  There is no drainage from the middle meatus.  Posterior nasopharynx was clear.  No polyps were noted on either side.  She has mild mucosal swelling but no evidence of infection. Oral: Lips and gums without lesions. Tongue and palate mucosa without lesions. Posterior oropharynx clear. Neck: No palpable adenopathy or masses Respiratory: Breathing comfortably  Skin: No facial/neck lesions or rash noted.  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  No intranasal masses noted.  Plan: Recommended regular use of nasal steroid spray and prescribed Nasacort 2 sprays each nostril at night or at least use Nasacort  regularly on the right side.  Discussed with her that she has no nasal polyps as she is felt like she might have.  Discussed with her that her nasal obstruction secondary to the septal deformity and that she would be a candidate for septoplasty and turbinate reductions if she did not get adequate relief with use of nasal steroid spray.  She will call us back if she decides to pursue septoplasty and turbinate reductions.  She will otherwise follow-up as needed.  Narda Bonds, MD

## 2020-05-22 ENCOUNTER — Other Ambulatory Visit: Payer: Self-pay

## 2020-05-22 ENCOUNTER — Other Ambulatory Visit: Payer: 59

## 2020-05-22 DIAGNOSIS — Z20822 Contact with and (suspected) exposure to covid-19: Secondary | ICD-10-CM

## 2020-05-25 LAB — NOVEL CORONAVIRUS, NAA: SARS-CoV-2, NAA: DETECTED — AB

## 2020-05-26 ENCOUNTER — Telehealth: Payer: Self-pay | Admitting: *Deleted

## 2020-05-26 NOTE — Telephone Encounter (Signed)
Called to discuss with patient about COVID-19 symptoms and the use of one of the available treatments for those with mild to moderate Covid symptoms and at a high risk of hospitalization.  Pt appears to qualify for outpatient treatment due to co-morbid conditions and/or a member of an at-risk group in accordance with the FDA Emergency Use Authorization.    Symptom onset:  Vaccinated:  Booster?  Immunocompromised?  Qualifiers:   Unable to reach pt - No answer.   Eunique Balik Kaye   

## 2020-05-27 ENCOUNTER — Other Ambulatory Visit: Payer: 59

## 2022-05-11 ENCOUNTER — Encounter: Payer: Self-pay | Admitting: Emergency Medicine

## 2022-05-11 ENCOUNTER — Ambulatory Visit
Admission: EM | Admit: 2022-05-11 | Discharge: 2022-05-11 | Disposition: A | Discharge status: Home / Self Care | Payer: 59 | Attending: Urgent Care | Admitting: Urgent Care

## 2022-05-11 DIAGNOSIS — Z23 Encounter for immunization: Secondary | ICD-10-CM | POA: Diagnosis not present

## 2022-05-11 DIAGNOSIS — S51811A Laceration without foreign body of right forearm, initial encounter: Secondary | ICD-10-CM

## 2022-05-11 DIAGNOSIS — M79631 Pain in right forearm: Secondary | ICD-10-CM | POA: Diagnosis not present

## 2022-05-11 MED ORDER — TETANUS-DIPHTH-ACELL PERTUSSIS 5-2.5-18.5 LF-MCG/0.5 IM SUSY
0.5000 mL | PREFILLED_SYRINGE | Freq: Once | INTRAMUSCULAR | Status: AC
  Administered 2022-05-11: 0.5 mL via INTRAMUSCULAR

## 2022-05-11 NOTE — ED Triage Notes (Signed)
[place] off a ladder and fell on a wood pile last night around 8:30pm.  Laceration to right forearm.  Last tetanus about 8 years ago.

## 2022-05-11 NOTE — ED Provider Notes (Signed)
[facility]  Note:  This document was prepared using Dragon voice recognition software and may include unintentional dictation errors.  MRN: [id number] DOB: October 17, 1970  Subjective:   Katrina Molina is a 52 y.o. female presenting for right forearm pain secondary to a right forearm laceration.  Patient fell last night and our clinic was closed.  She accidentally tripped off of a ladder and ended up falling on a wood pile.  Tetanus needs to be updated.  No head injury, loss consciousness.   Current Facility-Administered Medications:    Tdap (BOOSTRIX) injection 0.5 mL, 0.5 mL, Intramuscular, Once, Jaynee Eagles, PA-C  Current Outpatient Medications:    amLODipine (NORVASC) 2.5 MG tablet, Take 1 tablet (2.5 mg total) by mouth daily., Disp: 30 tablet, Rfl: 3   escitalopram (LEXAPRO) 10 MG tablet, Take 10 mg by mouth daily., Disp: , Rfl: 11   hyoscyamine (LEVSIN SL) 0.125 MG SL tablet, Place 1 tablet (0.125 mg total) under the tongue every 8 (eight) hours as needed., Disp: 20 tablet, Rfl: 0   norethindrone-ethinyl estradiol 1/35 (Lamar 1/35, 28,) tablet, Take 1 tablet by mouth daily., Disp: , Rfl:    NORTREL 7/7/7 0.5/0.75/1-35 MG-MCG tablet, Take 1 tablet by mouth daily., Disp: , Rfl: 1   zolpidem (AMBIEN) 10 MG tablet, , Disp: , Rfl:    Allergies  Allergen Reactions   Aspirin    Ibuprofen    Penicillins Hives    Past Medical History:  Diagnosis Date   Insomnia      Past Surgical History:  Procedure Laterality Date   CESAREAN SECTION  2001    Family History  Problem Relation Age of Onset   Hypertension Mother    Heart disease Mother    Irritable bowel syndrome Mother    Colon cancer Neg Hx    Breast cancer Neg Hx     Social History   Tobacco Use   Smoking status: Never   Smokeless tobacco: Never  Substance Use Topics   Alcohol use: Yes    Comment: 1-2 drinks daily   Drug use: No    ROS   Objective:   Vitals: BP (!) 157/82 (BP  Location: Right Arm)   Pulse 82   Temp 98 F (36.7 C) (Oral)   Resp 18   SpO2 97%   Physical Exam Constitutional:      General: She is not in acute distress.    Appearance: Normal appearance. She is well-developed. She is not ill-appearing, toxic-appearing or diaphoretic.  HENT:     Head: Normocephalic and atraumatic.     Nose: Nose normal.     Mouth/Throat:     Mouth: Mucous membranes are moist.  Eyes:     General: No scleral icterus.       Right eye: No discharge.        Left eye: No discharge.     Extraocular Movements: Extraocular movements intact.  Cardiovascular:     Rate and Rhythm: Normal rate.  Pulmonary:     Effort: Pulmonary effort is normal.  Musculoskeletal:       Arms:  Skin:    General: Skin is warm and dry.  Neurological:     General: No focal deficit present.     Mental Status: She is alert and oriented to person, place, and time.  Psychiatric:        Mood and Affect: Mood normal.        Behavior: Behavior normal.    Tdap updated in clinic.  PROCEDURE NOTE: laceration repair Verbal consent obtained from patient.  Local anesthesia with 12cc Lidocaine 2% with epinephrine.  Wound explored for tendon, ligament damage. Wound scrubbed with soap and water and rinsed. Wound closed with 4-0 Prolene (simple interrupted) sutures.  Wound cleansed and dressed.    Assessment and Plan :   PDMP not reviewed this encounter.  1. Right forearm pain   2. Forearm laceration, right, initial encounter   3. Need for diphtheria-tetanus-pertussis (Tdap) vaccine      Laceration repaired successfully. Wound care reviewed. Recommended Tylenol and/or ibuprofen for pain control. Return-to-clinic precautions discussed, patient verbalized understanding. Otherwise, follow up in 10 days for suture removal. Counseled patient on potential for adverse effects with medications prescribed/recommended today, ER and return-to-clinic precautions discussed, patient verbalized  understanding.    Jaynee Eagles, PA-C 05/11/22 1239

## 2022-05-11 NOTE — Discharge Instructions (Signed)

## 2022-05-21 ENCOUNTER — Ambulatory Visit
Admission: RE | Admit: 2022-05-21 | Discharge: 2022-05-21 | Disposition: A | Discharge status: Home / Self Care | Payer: 59 | Source: Ambulatory Visit | Attending: Internal Medicine | Admitting: Internal Medicine

## 2022-05-21 NOTE — ED Triage Notes (Signed)
Here for suture removal from right forearm.

## 2023-05-16 ENCOUNTER — Encounter (INDEPENDENT_AMBULATORY_CARE_PROVIDER_SITE_OTHER): Payer: Self-pay

## 2023-05-16 ENCOUNTER — Ambulatory Visit (INDEPENDENT_AMBULATORY_CARE_PROVIDER_SITE_OTHER): Payer: 59 | Admitting: Otolaryngology

## 2023-05-16 VITALS — BP 159/101 | HR 76 | Ht 60.0 in | Wt 145.0 lb

## 2023-05-16 DIAGNOSIS — J343 Hypertrophy of nasal turbinates: Secondary | ICD-10-CM

## 2023-05-16 DIAGNOSIS — R0981 Nasal congestion: Secondary | ICD-10-CM

## 2023-05-16 DIAGNOSIS — J310 Chronic rhinitis: Secondary | ICD-10-CM | POA: Diagnosis not present

## 2023-05-16 DIAGNOSIS — J342 Deviated nasal septum: Secondary | ICD-10-CM | POA: Insufficient documentation

## 2023-05-16 MED ORDER — MUPIROCIN 2 % EX OINT: 1.0000 | TOPICAL_OINTMENT | Freq: Three times a day (TID) | CUTANEOUS | 3 refills | Status: AC

## 2023-05-16 NOTE — Progress Notes (Signed)
 Patient ID: Katrina Molina, female   DOB: [birth year], 53 y.o.   MRN: [id number]  CC: Chronic nasal obstruction  HPI:  Katrina Molina is a 53 y.o. female who presents today complaining of chronic nasal obstruction for 2+ years.  She was previously diagnosed with nasal septal deviation by Dr. Lonni Angle.  She was treated with Flonase nasal spray and over-the-counter allergy medications.  However, she continues to be symptomatic.  Due to her chronic nasal obstruction, she is a habitual mouth breather.  She has no previous ENT surgery.  Currently she denies any facial pain, fever, or visual change.  She does complain of occasional nasal sores.  She denies any recent sinusitis.  Past Medical History:  Diagnosis Date   Insomnia     Past Surgical History:  Procedure Laterality Date   CESAREAN SECTION  2001    Family History  Problem Relation Age of Onset   Hypertension Mother    Heart disease Mother    Irritable bowel syndrome Mother    Colon cancer Neg Hx    Breast cancer Neg Hx     Social History:  reports that she has never smoked. She has never used smokeless tobacco. She reports current alcohol use. She reports that she does not use drugs.  Allergies:  Allergies  Allergen Reactions   Aspirin    Ibuprofen    Penicillins Hives    Prior to Admission medications   Medication Sig Start Date End Date Taking? Authorizing Provider  amLODipine  (NORVASC ) 2.5 MG tablet Take 1 tablet (2.5 mg total) by mouth daily. 11/23/18  Yes Pollina, Lonni PARAS, MD  escitalopram (LEXAPRO) 10 MG tablet Take 10 mg by mouth daily. 06/29/15  Yes [provider]  hyoscyamine  (LEVSIN  SL) 0.125 MG SL tablet Place 1 tablet (0.125 mg total) under the tongue every 8 (eight) hours as needed. 06/29/16  Yes Kerman Vina HERO, NP  mupirocin  ointment (BACTROBAN ) 2 % Apply 1 Application topically 3 (three) times daily for 7 days. 05/16/23 05/23/23 Yes Karis Clunes, MD  norethindrone-ethinyl  estradiol 1/35 (ORTHO-NOVUM 1/35, 28,) tablet Take 1 tablet by mouth daily.   Yes [provider]  NORTREL 7/7/7 0.5/0.75/1-35 MG-MCG tablet Take 1 tablet by mouth daily. 06/27/15  Yes [provider]  zolpidem (AMBIEN) 10 MG tablet  07/09/15  Yes [provider]    Blood pressure (!) 159/101, pulse 76, height 5' (1.524 m), weight 145 lb (65.8 kg). Exam: General: Communicates without difficulty, well nourished, no acute distress. Head: Normocephalic, no evidence injury, no tenderness, facial buttresses intact without stepoff. Face/sinus: No tenderness to palpation and percussion. Facial movement is normal and symmetric. Eyes: PERRL, EOMI. No scleral icterus, conjunctivae clear. Neuro: CN II exam reveals vision grossly intact.  No nystagmus at any point of gaze. Ears: Auricles well formed without lesions.  Ear canals are intact without mass or lesion.  No erythema or edema is appreciated.  The TMs are intact without fluid. Nose: External evaluation reveals normal support and skin without lesions.  Dorsum is intact.  Anterior rhinoscopy reveals congested mucosa over anterior aspect of inferior turbinates and deviated septum.  No purulence noted. Oral:  Oral cavity and oropharynx are intact, symmetric, without erythema or edema.  Mucosa is moist without lesions. Neck: Full range of motion without pain.  There is no significant lymphadenopathy.  No masses palpable.  Thyroid bed within normal limits to palpation.  Parotid glands and submandibular glands equal bilaterally without mass.  Trachea is midline.  Neuro:  CN 2-12 grossly intact.   Procedure:  Flexible Nasal Endoscopy: Description: Risks, benefits, and alternatives of flexible endoscopy were explained to the patient.  Specific mention was made of the risk of throat numbness with difficulty swallowing, possible bleeding from the nose and mouth, and pain from the procedure.  The patient gave oral consent to proceed.  The flexible  scope was inserted into the right nasal cavity.  Endoscopy of the interior nasal cavity, superior, inferior, and middle meatus was performed. The sphenoid-ethmoid recess was examined. Edematous mucosa was noted.  No polyp, mass, or lesion was appreciated. Nasal septal deviation noted. Olfactory cleft was clear.  Nasopharynx was clear.  Turbinates were hypertrophied but without mass.  The procedure was repeated on the contralateral side with similar findings.  The patient tolerated the procedure well.    Assessment: 1.  Chronic rhinitis with nasal mucosal congestion, nasal septal deviation, and bilateral inferior turbinate hypertrophy. 2.  More than 95% of her nasal passageways are obstructed bilaterally. 3.  The patient has not responded to medical treatment with steroid nasal sprays and allergy medications. 4.  History of recurrent nasal impetigo.  Plan: 1.  The physical exam and nasal endoscopy findings are reviewed with the patient. 2.  Flonase nasal spray 2 sprays each nostril daily. 3.  Mupirocin  ointment to treat the recurrent nasal impetigo. 4.  In light of her persistent nasal obstruction, she may benefit from surgical intervention with septoplasty and bilateral turbinate reduction.  The risk, benefits, and details of the procedures are reviewed with the patient.  Questions are invited and answered. 5.  The patient would like to proceed with the procedures.  Aquilla Shambley W Jeremi Losito 05/16/2023, 3:43 PM

## 2023-05-26 DIAGNOSIS — J3489 Other specified disorders of nose and nasal sinuses: Secondary | ICD-10-CM | POA: Diagnosis not present

## 2023-05-26 DIAGNOSIS — J343 Hypertrophy of nasal turbinates: Secondary | ICD-10-CM | POA: Diagnosis not present

## 2023-05-26 DIAGNOSIS — J342 Deviated nasal septum: Secondary | ICD-10-CM | POA: Diagnosis not present

## 2023-05-27 ENCOUNTER — Telehealth (INDEPENDENT_AMBULATORY_CARE_PROVIDER_SITE_OTHER): Payer: Self-pay | Admitting: Otolaryngology

## 2023-05-27 NOTE — Telephone Encounter (Signed)
confirmed appt & location [id number] afm

## 2023-05-30 ENCOUNTER — Ambulatory Visit (INDEPENDENT_AMBULATORY_CARE_PROVIDER_SITE_OTHER): Payer: 59 | Admitting: Otolaryngology

## 2023-05-30 ENCOUNTER — Encounter (INDEPENDENT_AMBULATORY_CARE_PROVIDER_SITE_OTHER): Payer: Self-pay

## 2023-05-30 VITALS — BP 148/89 | HR 75 | Ht 60.0 in | Wt 145.0 lb

## 2023-05-30 DIAGNOSIS — Z09 Encounter for follow-up examination after completed treatment for conditions other than malignant neoplasm: Secondary | ICD-10-CM

## 2023-05-30 DIAGNOSIS — J342 Deviated nasal septum: Secondary | ICD-10-CM

## 2023-05-30 DIAGNOSIS — J343 Hypertrophy of nasal turbinates: Secondary | ICD-10-CM

## 2023-05-30 DIAGNOSIS — J310 Chronic rhinitis: Secondary | ICD-10-CM

## 2023-05-30 NOTE — Progress Notes (Signed)
Patient ID: Katrina Molina, female   DOB: August 06, 1970, 53 y.o.   MRN: [id number]  Ralph Leyden splints removed. Septum and turbinates are healing well.   Both [place] debrided.  Nasal saline irrigation.  Recheck in 3 weeks.

## 2023-06-20 ENCOUNTER — Telehealth (INDEPENDENT_AMBULATORY_CARE_PROVIDER_SITE_OTHER): Payer: Self-pay | Admitting: Otolaryngology

## 2023-06-20 NOTE — Telephone Encounter (Signed)
 Reminder Call: Date: 06/21/2023 Status: Sch  Time: 1:40 PM [address] [place], [place] [address]  Confirmed time and location w/patient.

## 2023-06-21 ENCOUNTER — Ambulatory Visit (INDEPENDENT_AMBULATORY_CARE_PROVIDER_SITE_OTHER): Payer: 59 | Admitting: Otolaryngology

## 2023-06-21 ENCOUNTER — Encounter (INDEPENDENT_AMBULATORY_CARE_PROVIDER_SITE_OTHER): Payer: Self-pay

## 2023-06-21 VITALS — BP 153/94 | HR 73 | Ht 60.0 in | Wt 145.0 lb

## 2023-06-21 DIAGNOSIS — J310 Chronic rhinitis: Secondary | ICD-10-CM

## 2023-06-21 NOTE — Progress Notes (Signed)
Patient ID: Katrina Molina, female   DOB: [birth year], 53 y.o.   MRN: [id number]  Septum and turbinates are healing well.   Both [place] debrided.   Nasal saline irrigation as needed.   Recheck in 6 months.

## 2023-06-27 ENCOUNTER — Ambulatory Visit: Payer: Self-pay

## 2023-12-01 ENCOUNTER — Encounter (HOSPITAL_COMMUNITY): Payer: Self-pay | Admitting: *Deleted

## 2023-12-01 ENCOUNTER — Ambulatory Visit
Admission: EM | Admit: 2023-12-01 | Discharge: 2023-12-01 | Disposition: A | Discharge status: Home / Self Care | Attending: Nurse Practitioner | Admitting: Nurse Practitioner

## 2023-12-01 ENCOUNTER — Emergency Department (HOSPITAL_COMMUNITY)
Admission: EM | Admit: 2023-12-01 | Discharge: 2023-12-01 | Disposition: A | Discharge status: Home / Self Care | Source: Ambulatory Visit | Attending: Emergency Medicine | Admitting: Emergency Medicine

## 2023-12-01 ENCOUNTER — Emergency Department (HOSPITAL_COMMUNITY)

## 2023-12-01 ENCOUNTER — Other Ambulatory Visit: Payer: Self-pay

## 2023-12-01 DIAGNOSIS — R1032 Left lower quadrant pain: Secondary | ICD-10-CM | POA: Diagnosis not present

## 2023-12-01 HISTORY — DX: Irritable bowel syndrome, unspecified: K58.9

## 2023-12-01 LAB — COMPREHENSIVE METABOLIC PANEL WITH GFR
ALT: 20 U/L (ref 0–44)
AST: 26 U/L (ref 15–41)
Albumin: 4.6 g/dL (ref 3.5–5.0)
Alkaline Phosphatase: 81 U/L (ref 38–126)
Anion gap: 15 (ref 5–15)
BUN: 6 mg/dL (ref 6–20)
CO2: 25 mmol/L (ref 22–32)
Calcium: 9.9 mg/dL (ref 8.9–10.3)
Chloride: 99 mmol/L (ref 98–111)
Creatinine, Ser: 0.57 mg/dL (ref 0.44–1.00)
GFR, Estimated: >60 mL/min (ref >60)
Glucose, Bld: 94 mg/dL (ref 70–99)
Potassium: 3.7 mmol/L (ref 3.5–5.1)
Sodium: 139 mmol/L (ref 135–145)
Total Bilirubin: 0.6 mg/dL (ref 0.0–1.2)
Total Protein: 8.1 g/dL (ref 6.5–8.1)

## 2023-12-01 LAB — URINALYSIS, ROUTINE W REFLEX MICROSCOPIC
Bacteria, UA: NONE SEEN
Bilirubin Urine: NEGATIVE
Glucose, UA: NEGATIVE mg/dL
Ketones, ur: NEGATIVE mg/dL
Leukocytes,Ua: NEGATIVE
Nitrite: NEGATIVE
Protein, ur: NEGATIVE mg/dL
Specific Gravity, Urine: 1.003 — ABNORMAL LOW (ref 1.005–1.030)
pH: 6 (ref 5.0–8.0)

## 2023-12-01 LAB — CBC
HCT: 44.5 % (ref 36.0–46.0)
Hemoglobin: 14.8 g/dL (ref 12.0–15.0)
MCH: 31.4 pg (ref 26.0–34.0)
MCHC: 33.3 g/dL (ref 30.0–36.0)
MCV: 94.3 fL (ref 80.0–100.0)
Platelets: 257 K/uL (ref 150–400)
RBC: 4.72 MIL/uL (ref 3.87–5.11)
RDW: 14.3 % (ref 11.5–15.5)
WBC: 6.6 K/uL (ref 4.0–10.5)
nRBC: 0 % (ref 0.0–0.2)

## 2023-12-01 LAB — POCT URINALYSIS DIP (MANUAL ENTRY)
Bilirubin, UA: NEGATIVE
Glucose, UA: NEGATIVE mg/dL
Ketones, POC UA: NEGATIVE mg/dL
Leukocytes, UA: NEGATIVE
Nitrite, UA: NEGATIVE
Protein Ur, POC: NEGATIVE mg/dL
Spec Grav, UA: 1.01 (ref 1.010–1.025)
Urobilinogen, UA: 0.2 U/dL
pH, UA: 6 (ref 5.0–8.0)

## 2023-12-01 LAB — LIPASE, BLOOD: Lipase: 83 U/L — ABNORMAL HIGH (ref 11–51)

## 2023-12-01 MED ORDER — IOHEXOL 300 MG/ML  SOLN
100.0000 mL | Freq: Once | INTRAMUSCULAR | Status: AC | PRN
  Administered 2023-12-01: 100 mL via INTRAVENOUS

## 2023-12-01 NOTE — ED Triage Notes (Signed)
 Pt with abd pain since Saturday night. Hx of IBS, on medication for it and medication has not helped.  Pt sent from UC due to feeling a lump to LLQ. + loose stools.

## 2023-12-01 NOTE — ED Triage Notes (Signed)
 Pt reports she has lower LQ abdominal pain and loose stools x 5 days.

## 2023-12-01 NOTE — Discharge Instructions (Signed)
 Please follow-up closely with your primary care doctor on an outpatient basis.  Please continue taking the Levsin  as directed.  Return to emergency department immediately for any new or worsening symptoms.

## 2023-12-01 NOTE — ED Notes (Signed)
 Patient is being discharged from the Urgent Care and sent to the Emergency Department via POV . Per provider, patient is in need of higher level of care due to LLQ abdominal pain. Patient is aware and verbalizes understanding of plan of care.  Vitals:   12/01/23 1204  BP: (!) 170/96  Pulse: 77  Resp: 18  Temp: 98.5 F (36.9 C)  SpO2: 100%

## 2023-12-01 NOTE — Discharge Instructions (Addendum)
Please go to the emergency department for further evaluation.

## 2023-12-01 NOTE — ED Provider Notes (Signed)
 [facility]    CSN: [id number] Arrival date & time: 12/01/23  1154      History   Chief Complaint Chief Complaint  Patient presents with   Abdominal Pain    HPI Katrina Molina is a 53 y.o. female.   The history is provided by the patient.   Patient presents for complaints of left lower quadrant abdominal pain has been present for the past several days.  Patient reports prior history of same dating back to her 79s.  Patient states that she currently rates her pain 4-5/10, states it was 10/10 when it initially started.  Patient also endorses some loose stools.  Patient reports that she does have a prior history of IBS.  States that she has an old prescription of Levsin  that she normally takes when she has flares which helped her symptoms.  She states this time, her symptoms did not improve despite use of the Levsin .  Patient states when she woke this morning she also had a low-grade temperature of around 99.5.  She denies fever, chills, nausea, vomiting, gas, or bloating.  States that she does feel like there is something different in her left lower abdomen.  She states that she feels something there.  Patient has not seen GI since 2018, per last note with GI, patient was considering colonoscopy.  Patient states that she never followed through.  Patient states that she has been eating more foods from her garden such as tomatoes and cucumbers which she thinks may have caused her IBS to flare.  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      Home  Medications    Prior to Admission medications   Medication Sig Start Date End Date Taking? Authorizing Provider  amLODipine  (NORVASC ) 2.5 MG tablet Take 1 tablet (2.5 mg total) by mouth daily. 11/23/18   Haze Lonni PARAS, MD  escitalopram (LEXAPRO) 10 MG tablet Take 10 mg by mouth daily. 06/29/15   [provider]  hyoscyamine  (LEVSIN  SL) 0.125 MG SL tablet Place 1 tablet (0.125 mg total) under the tongue every 8 (eight) hours as needed. 06/29/16   Kerman Vina HERO, NP  norethindrone-ethinyl estradiol 1/35 (ORTHO-NOVUM 1/35, 28,) tablet Take 1 tablet by mouth daily. Patient not taking: Reported on 06/21/2023    [provider]  NORTREL 7/7/7 0.5/0.75/1-35 MG-MCG tablet Take 1 tablet by mouth daily. 06/27/15   [provider]  zolpidem (AMBIEN) 10 MG tablet  07/09/15   [provider]    Family History Family History  Problem Relation Age of Onset   Hypertension Mother    Heart disease Mother    Irritable bowel syndrome Mother    Colon cancer Neg Hx    Breast cancer Neg Hx     Social History Social History   Tobacco Use   Smoking status: Never   Smokeless tobacco: Never  Substance Use Topics   Alcohol use: Yes    Comment: 1-2 drinks daily   Drug use: No     Allergies  Aspirin, Ibuprofen, and Penicillins   Review of Systems Review of Systems Per HPI  Physical Exam Triage Vital Signs ED Triage Vitals  Encounter Vitals Group     BP 12/01/23 1204 (!) 170/96     Girls Systolic BP Percentile --      Girls Diastolic BP Percentile --      Boys Systolic BP Percentile --      Boys Diastolic BP Percentile --      Pulse Rate 12/01/23 1204 77     Resp 12/01/23 1204 18     Temp 12/01/23 1204 98.5 F (36.9 C)     Temp Source 12/01/23 1204 Oral     SpO2 12/01/23 1204 100 %     Weight --      Height --      Head Circumference --      Peak Flow --      Pain Score 12/01/23 1205 4     Pain Loc --      Pain Education --      Exclude  from Growth Chart --    No data found.  Updated Vital Signs BP (!) 170/96 (BP Location: Right Arm)   Pulse 77   Temp 98.5 F (36.9 C) (Oral)   Resp 18   LMP 11/23/2018 (Exact Date)   SpO2 100%   Visual Acuity Right Eye Distance:   Left Eye Distance:   Bilateral Distance:    Right Eye Near:   Left Eye Near:    Bilateral Near:     Physical Exam Vitals and nursing note reviewed.  Constitutional:      General: She is not in acute distress.    Appearance: She is well-developed.  HENT:     Head: Normocephalic.  Eyes:     Extraocular Movements: Extraocular movements intact.     Pupils: Pupils are equal, round, and reactive to light.  Cardiovascular:     Rate and Rhythm: Normal rate and regular rhythm.     Pulses: Normal pulses.     Heart sounds: Normal heart sounds.  Pulmonary:     Effort: Pulmonary effort is normal.     Breath sounds: Normal breath sounds.  Abdominal:     General: There is no distension.     Tenderness: There is abdominal tenderness in the left lower quadrant.  Musculoskeletal:     Cervical back: Normal range of motion.  Skin:    General: Skin is warm and dry.  Neurological:     General: No focal deficit present.     Mental Status: She is alert and oriented to person, place, and time.  Psychiatric:        Mood and Affect: Mood normal.        Behavior: Behavior normal.      UC Treatments / Results  Labs (all labs ordered are listed, but only abnormal results are displayed) Labs Reviewed  POCT URINALYSIS DIP (MANUAL ENTRY) - Abnormal; Notable for the following components:      Result Value   Blood, UA trace-intact (*)    All other components within normal limits    EKG   Radiology No results found.  Procedures Procedures (including critical care time)  Medications Ordered in UC Medications - No data to display  Initial Impression / Assessment and Plan / UC Course  I have reviewed the triage vital signs and the nursing  notes.  Pertinent labs & imaging results that were available during my care of the patient  were reviewed by me and considered in my medical decision making (see chart for details).  On exam, the patient's vital signs are mostly stable, she is hypertensive however.  She is afebrile, she has no nausea or vomiting.  She does have abdominal tenderness noted in the left lower quadrant.  Patient has been taking medication for IBS, but states medication is no longer working.  She states that she also feels something abnormal in her left lower abdomen.  Urinalysis performed in this clinic did not indicate clear etiology regarding the patient's symptoms.  Given the patient's current presentation and symptoms and limited resources in this clinic, patient was advised it is recommended that she follow-up in the emergency department for further evaluation.  Patient was in agreement with this plan of care and verbalized understanding.  Patient's vital signs are stable, she is able to travel via private vehicle.  Patient discharged to the ED. Final Clinical Impressions(s) / UC Diagnoses   Final diagnoses:  Abdominal pain, left lower quadrant     Discharge Instructions      Please go to the emergency department for further evaluation.     ED Prescriptions   None    PDMP not reviewed this encounter.   Gilmer Etta PARAS, NP 12/01/23 1502

## 2023-12-01 NOTE — ED Provider Notes (Signed)
  EMERGENCY DEPARTMENT AT [facility] Provider Note   CSN: [id number] Arrival date & time: 12/01/23  1245     Patient presents with: Abdominal Pain   Katrina Molina is a 53 y.o. female.   Patient is a 53 year old female who presents emergency department chief complaint of left lower quadrant abdominal pain which has been ongoing for the past few days.  She denies any associated nausea, vomiting but has had some diarrhea.  She notes that she does have a history of similar symptoms in the past as well as a history of IBS.  She is currently on Levsin  at this time.  She denies any fever, chills, chest pain, shortness of breath.  She denies any dysuria or hematuria.   Abdominal Pain      Prior to Admission medications   Medication Sig Start Date End Date Taking? Authorizing Provider  amLODipine  (NORVASC ) 2.5 MG tablet Take 1 tablet (2.5 mg total) by mouth daily. 11/23/18   Haze Lonni PARAS, MD  escitalopram (LEXAPRO) 10 MG tablet Take 10 mg by mouth daily. 06/29/15   [provider]  hyoscyamine  (LEVSIN  SL) 0.125 MG SL tablet Place 1 tablet (0.125 mg total) under the tongue every 8 (eight) hours as needed. 06/29/16   Kerman Vina HERO, NP  norethindrone-ethinyl estradiol 1/35 (ORTHO-NOVUM 1/35, 28,) tablet Take 1 tablet by mouth daily. Patient not taking: Reported on 06/21/2023    [provider]  NORTREL 7/7/7 0.5/0.75/1-35 MG-MCG tablet Take 1 tablet by mouth daily. 06/27/15   [provider]  zolpidem (AMBIEN) 10 MG tablet  07/09/15   [provider]    Allergies: Aspirin, Ibuprofen, and Penicillins    Review of Systems  Gastrointestinal:  Positive for abdominal pain.    Updated Vital Signs BP (!) 165/89   Pulse 79   Temp 98.5 F (36.9 C) (Oral)   Resp 18   Ht 5' (1.524 m)   Wt 59.5 kg   LMP 11/23/2018 (Exact Date)   SpO2 95%   BMI 25.62 kg/m   Physical Exam Vitals and nursing note reviewed.   Constitutional:      Appearance: Normal appearance.  HENT:     Head: Normocephalic and atraumatic.     Nose: Nose normal.     Mouth/Throat:     Mouth: Mucous membranes are moist.  Eyes:     Extraocular Movements: Extraocular movements intact.     Conjunctiva/sclera: Conjunctivae normal.     Pupils: Pupils are equal, round, and reactive to light.  Cardiovascular:     Rate and Rhythm: Normal rate and regular rhythm.     Pulses: Normal pulses.     Heart sounds: Normal heart sounds.     No gallop.  Pulmonary:     Effort: Pulmonary effort is normal. No respiratory distress.     Breath sounds: Normal breath sounds. No stridor. No wheezing, rhonchi or rales.  Abdominal:     General: Abdomen is flat. Bowel sounds are normal. There is no distension. There are no signs of injury.     Palpations: Abdomen is soft.     Tenderness: There is no abdominal tenderness.     Hernia: No hernia is present.  Musculoskeletal:        General: Normal range of motion.     Cervical back: Normal range of motion and neck supple.  Skin:    General: Skin is warm and dry.     Coloration: Skin is not jaundiced.  Findings: No rash.  Neurological:     General: No focal deficit present.     Mental Status: She is alert and oriented to person, place, and time. Mental status is at baseline.  Psychiatric:        Mood and Affect: Mood normal.        Behavior: Behavior normal.        Thought Content: Thought content normal.        Judgment: Judgment normal.     (all labs ordered are listed, but only abnormal results are displayed) Labs Reviewed  LIPASE, BLOOD - Abnormal; Notable for the following components:      Result Value   Lipase 83 (*)    All other components within normal limits  URINALYSIS, ROUTINE W REFLEX MICROSCOPIC - Abnormal; Notable for the following components:   Color, Urine STRAW (*)    Specific Gravity, Urine 1.003 (*)    Hgb urine dipstick [place] (*)    All other components within  normal limits  COMPREHENSIVE METABOLIC PANEL WITH GFR  CBC    EKG: None  Radiology: CT ABDOMEN PELVIS W CONTRAST Result Date: 12/01/2023 CLINICAL DATA:  Acute left lower quadrant abdominal pain. EXAM: CT ABDOMEN AND PELVIS WITH CONTRAST TECHNIQUE: Multidetector CT imaging of the abdomen and pelvis was performed using the standard protocol following bolus administration of intravenous contrast. RADIATION DOSE REDUCTION: This exam was performed according to the departmental dose-optimization program which includes automated exposure control, adjustment of the mA and/or kV according to patient size and/or use of iterative reconstruction technique. CONTRAST:  OMNIPAQUE  IOHEXOL  300 MG/ML  SOLN COMPARISON:  Sep 19, 2012. FINDINGS: Lower chest: No acute abnormality. Hepatobiliary: No focal liver abnormality is seen. No gallstones, gallbladder wall thickening, or biliary dilatation. Pancreas: Unremarkable. No pancreatic ductal dilatation or surrounding inflammatory changes. Spleen: Normal in size without focal abnormality. Adrenals/Urinary Tract: Adrenal glands are unremarkable. Kidneys are normal, without renal calculi, focal lesion, or hydronephrosis. Bladder is unremarkable. Stomach/Bowel: Stomach is within normal limits. Appendix appears normal. No evidence of bowel wall thickening, distention, or inflammatory changes. Vascular/Lymphatic: Aortic atherosclerosis. No enlarged abdominal or pelvic lymph nodes. Reproductive: Uterus and bilateral adnexa are unremarkable. Other: No abdominal wall hernia or abnormality. No abdominopelvic ascites. Musculoskeletal: No acute or significant osseous findings. IMPRESSION: No acute abnormality seen in the abdomen or pelvis. Aortic Atherosclerosis (ICD10-I70.0). Electronically Signed   By: Lynwood Landy Raddle M.D.   On: 12/01/2023 16:24     Procedures   Medications Ordered in the ED  iohexol  (OMNIPAQUE ) 300 MG/ML solution 100 mL (100 mLs Intravenous Contrast Given  12/01/23 1528)                                    Medical Decision Making Patient is doing well at this time and is stable for discharge home.  Discussed with patient that CT scan of abdomen and pelvis demonstrated no signs of acute surgical process.  Blood work has otherwise been unremarkable.  Suspect bowel spasms at this point.  She has no clinical indication for acute appendicitis, cholecystitis, bowel torsion, diverticulitis, ovarian torsion cyst, PID, tumor and abscess, pyelonephritis, kidney stone.  Discussed the need for close follow-up with her primary care doctor on an outpatient basis.  Strict turn precautions were discussed as well for any new or worsening symptoms.  Patient voiced understanding and had no additional questions.  Amount and/or Complexity of Data Reviewed  Labs: ordered. Radiology: ordered.  Risk Prescription drug management.        Final diagnoses:  None    ED Discharge Orders     None          Daralene Lonni JONETTA DEVONNA 12/01/23 1709    Towana Ozell BROCKS, MD 12/02/23 [phone]

## 2023-12-22 ENCOUNTER — Encounter (INDEPENDENT_AMBULATORY_CARE_PROVIDER_SITE_OTHER): Payer: Self-pay | Admitting: Otolaryngology

## 2023-12-22 ENCOUNTER — Ambulatory Visit (INDEPENDENT_AMBULATORY_CARE_PROVIDER_SITE_OTHER): Payer: 59 | Admitting: Otolaryngology

## 2023-12-22 VITALS — BP 119/78 | HR 77

## 2023-12-22 DIAGNOSIS — J310 Chronic rhinitis: Secondary | ICD-10-CM

## 2023-12-22 DIAGNOSIS — J343 Hypertrophy of nasal turbinates: Secondary | ICD-10-CM

## 2023-12-22 DIAGNOSIS — R0981 Nasal congestion: Secondary | ICD-10-CM | POA: Diagnosis not present

## 2023-12-24 NOTE — Progress Notes (Signed)
 Patient ID: Shauniece Molina, female   DOB: 1970/10/31, 53 y.o.   MRN: [id number]  Follow-up: Chronic nasal obstruction  HPI: The patient is a 53 year old female who returns today for her follow-up evaluation.  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  He was treated with septoplasty and bilateral turbinate reduction surgery in January 2025.  The patient returns today reporting significant improvement in her nasal breathing.  She has noted only mild nasal congestion during the spring allergy season.  Currently she denies any facial pain, fever, or visual change.  Exam: General: Communicates without difficulty, well nourished, no acute distress. Head: Normocephalic, no evidence injury, no tenderness, facial buttresses intact without stepoff. Face/sinus: No tenderness to palpation and percussion. Facial movement is normal and symmetric. Eyes: PERRL, EOMI. No scleral icterus, conjunctivae clear. Neuro: CN II exam reveals vision grossly intact.  No nystagmus at any point of gaze. Ears: Auricles well formed without lesions.  Ear canals are intact without mass or lesion.  No erythema or edema is appreciated.  The TMs are intact without fluid. Nose: External evaluation reveals normal support and skin without lesions.  Dorsum is intact.  Anterior rhinoscopy reveals mildly congested mucosa over anterior aspect of inferior turbinates and intact septum.  No purulence noted. Oral:  Oral cavity and oropharynx are intact, symmetric, without erythema or edema.  Mucosa is moist without lesions. Neck: Full range of motion without pain.  There is no significant lymphadenopathy.  No masses palpable.  Thyroid bed within normal limits to palpation.  Parotid glands and submandibular glands equal bilaterally without mass.  Trachea is midline. Neuro:  CN 2-12 grossly intact.   Assessment: 1.  Chronic rhinitis with mild nasal mucosal  congestion. 2.  The septum and turbinates are well-healed.  Her nasal passageways are patent bilaterally.  Plan: 1.  The physical exam findings are reviewed with the patient. 2.  Nasal saline irrigation and Flonase nasal spray as needed. 3.  The patient is encouraged to call with any questions or concerns.

## 2024-01-30 NOTE — Progress Notes (Unsigned)
 01/31/2024 Katrina Molina [id number] March 13, 1971  Referring provider: Shona Norleen PEDLAR, MD Primary GI doctor: Dr. Shila  ASSESSMENT AND PLAN:  LLQ pain with feeling of mass/bulge CT 11/2023 at ER unremarkable, history of LLQ pain x 2014 worse with activities and sexual intercourse radiates to lower back Better with levsin  Previous workup 2018 including CT MRI pelvis evaluation with urology GYN 12/01/2023 CTAP W for left lower quadrant pain unremarkable other than aortic atherosclerosis -Get Sed rate, CRP, celiac pane, KUB - will schedule for colonoscopy to evaluate AB pain and change in bowel habits, We have discussed the risks of bleeding, infection, perforation, medication reactions, and remote risk of death associated with colonoscopy. All questions were answered and the patient acknowledges these risk and wishes to proceed. -Consider SIBO testing or xifaxin trial pending results - consider pelvic floor PT, information given -Can do trial of IBGARD daily, continue Levsin  -FODMAP,  and lifestyle changes discussed  Irregular bowel movements, likely IBS mixed or diarrhea predominant per patient Will have 3-4 Bm's in a day, complete BM, hard to loose stools No hematochezia -Consider SIBO testing or xifaxin trial pending results - consider pelvic floor PT, information given -Can do trial of IBGARD daily, continue Levsin  -FODMAP,  and lifestyle changes discussed  Rectal bleeding Discussed potential colonoscopy in 2018 however this was not pursued 12/01/2023 CBC without anemia, normal kidney liver Cologuard normal 05/2022  Patient Care Team: Katrina Norleen PEDLAR, MD as PCP - General (Internal Medicine)  HISTORY OF PRESENT ILLNESS: 53 y.o. female with a past medical history listed below presents for evaluation of LLQ.   Patient last seen in the office 06/29/2016 by Katrina Molina, NPfor left lower quadrant abdominal pain, with significant workup of CT scan, MRI pelvis, and evaluation  with urology and GYN.  Worse with bending and intercourse.  Given dicyclomine and suggested following up with primary care or orthopedic for L5-S1 disc bulge. Prescribed levsin  and this helped tremendously until recently.  Discussed the use of AI scribe software for clinical note transcription with the patient, who gave verbal consent to proceed.  History of Present Illness   Katrina Molina is a 53 year old female who presents with chronic lower abdominal pain and irregular bowel habits.  She has experienced chronic lower abdominal pain since at least 2018, typically located in the left lower abdomen and relieved by standing up. Initially, hyoscyamine  provided significant relief, but its effectiveness has diminished this year. The pain is bothersome when sitting but does not prevent her from functioning. Occasionally, the pain becomes external, noticeable when lying down or standing up straight, prompting urgent care visits. A CT scan at the ER showed no abnormalities. She continues to experience occasional abdominal sensations, described as 'twerks', felt both externally and internally.  Her bowel habits have always been irregular, with frequent bowel movements ranging from three to four times a day. Stools vary from solid to loose, with a tendency towards being more loose. No blood in the stool, dark black stools, heartburn, nausea, or vomiting. She has not had a colonoscopy.  She has been trying to identify dietary triggers for her symptoms, noting a love for spicy and acidic foods, particularly garlic and tomatoes. She has attempted to moderate her intake of these foods, especially after noticing a potential link between garlic consumption and symptom exacerbation. She has also cut out carbs after being told her sugar was high, resulting in weight loss from 160 to 120 pounds.  She  reports that she has never smoked. She has never used smokeless tobacco. She reports current alcohol use.  She reports that she does not use drugs.  RELEVANT GI HISTORY, IMAGING AND LABS: Results   RADIOLOGY Abdominal CT (12/01/2023): Liver normal, no gallstones, pancreas normal, spleen normal, adrenal glands normal, stomach normal, appendix normal, no bowel thickening, distention, or inflammatory changes.      CBC    Component Value Date/Time   WBC 6.6 12/01/2023 1316   RBC 4.72 12/01/2023 1316   HGB 14.8 12/01/2023 1316   HCT 44.5 12/01/2023 1316   PLT 257 12/01/2023 1316   MCV 94.3 12/01/2023 1316   MCH 31.4 12/01/2023 1316   MCHC 33.3 12/01/2023 1316   RDW 14.3 12/01/2023 1316   Recent Labs    12/01/23 1316  HGB 14.8    CMP     Component Value Date/Time   NA 139 12/01/2023 1316   K 3.7 12/01/2023 1316   CL 99 12/01/2023 1316   CO2 25 12/01/2023 1316   GLUCOSE 94 12/01/2023 1316   BUN 6 12/01/2023 1316   CREATININE 0.57 12/01/2023 1316   CALCIUM 9.9 12/01/2023 1316   PROT 8.1 12/01/2023 1316   ALBUMIN 4.6 12/01/2023 1316   AST 26 12/01/2023 1316   ALT 20 12/01/2023 1316   ALKPHOS 81 12/01/2023 1316   BILITOT 0.6 12/01/2023 1316   GFRNONAA >60 12/01/2023 1316      Latest Ref Rng & Units 12/01/2023    1:16 PM  Hepatic Function  Total Protein 6.5 - 8.1 g/dL 8.1   Albumin 3.5 - 5.0 g/dL 4.6   AST 15 - 41 U/L 26   ALT 0 - 44 U/L 20   Alk Phosphatase 38 - 126 U/L 81   Total Bilirubin 0.0 - 1.2 mg/dL 0.6       Current Medications:    Current Outpatient Medications (Cardiovascular):    amLODipine  (NORVASC ) 2.5 MG tablet, Take 1 tablet (2.5 mg total) by mouth daily.     Current Outpatient Medications (Other):    ALPRAZolam (XANAX) 0.5 MG tablet, Take 0.5 mg by mouth daily as needed.   escitalopram (LEXAPRO) 10 MG tablet, Take 10 mg by mouth daily.   hyoscyamine  (LEVSIN ) 0.125 MG tablet, Take 2 tablets (0.25 mg total) by mouth every 6 (six) hours as needed for cramping.   zolpidem (AMBIEN) 10 MG tablet,   Medical History:  Past Medical History:   Diagnosis Date   Hypertension    IBS (irritable bowel syndrome)    Insomnia    Allergies:  Allergies  Allergen Reactions   Aspirin    Ibuprofen    Penicillins Hives     Surgical History:  She  has a past surgical history that includes Cesarean section (05/11/1999) and Nasal septum surgery (Bilateral). Family History:  Her family history includes Heart disease in her mother; Hypertension in her mother; Irritable bowel syndrome in her mother.  REVIEW OF SYSTEMS  : All other systems reviewed and negative except where noted in the History of Present Illness.  PHYSICAL EXAM: BP 134/74 (BP Location: Left Arm, Patient Position: Sitting, Cuff Size: Normal)   Pulse 82   Ht 5' (1.524 m)   Wt 128 lb 4 oz (58.2 kg)   LMP 11/23/2018 (Exact Date)   BMI 25.05 kg/m  Physical Exam   GENERAL APPEARANCE: Well nourished, in no apparent distress. HEENT: No cervical lymphadenopathy, unremarkable thyroid, sclerae anicteric, conjunctiva pink. RESPIRATORY: Respiratory effort normal, breath sounds equal  bilaterally without rales, rhonchi, or wheezing. CARDIO: Regular rate and rhythm with no murmurs, rubs, or gallops, peripheral pulses intact. ABDOMEN: Soft, non-distended, decreased bowel sounds, non-tender to palpation, no rebound, no mass appreciated. RECTAL: Declines. MUSCULOSKELETAL: Full range of motion, normal gait, without edema. SKIN: Dry, intact without rashes or lesions. No jaundice. NEURO: Alert, oriented, no focal deficits. PSYCH: Cooperative, normal mood and affect.      Alan JONELLE Coombs, PA-C 11:33 AM

## 2024-01-31 ENCOUNTER — Ambulatory Visit: Payer: Self-pay | Admitting: Physician Assistant

## 2024-01-31 ENCOUNTER — Other Ambulatory Visit (INDEPENDENT_AMBULATORY_CARE_PROVIDER_SITE_OTHER)

## 2024-01-31 ENCOUNTER — Encounter: Payer: Self-pay | Admitting: Physician Assistant

## 2024-01-31 ENCOUNTER — Ambulatory Visit: Admitting: Physician Assistant

## 2024-01-31 ENCOUNTER — Ambulatory Visit (INDEPENDENT_AMBULATORY_CARE_PROVIDER_SITE_OTHER)
Admission: RE | Admit: 2024-01-31 | Discharge: 2024-01-31 | Disposition: A | Discharge status: Home / Self Care | Source: Ambulatory Visit | Attending: Physician Assistant | Admitting: Physician Assistant

## 2024-01-31 VITALS — BP 134/74 | HR 82 | Ht 60.0 in | Wt 128.2 lb

## 2024-01-31 DIAGNOSIS — R1032 Left lower quadrant pain: Secondary | ICD-10-CM

## 2024-01-31 DIAGNOSIS — K625 Hemorrhage of anus and rectum: Secondary | ICD-10-CM | POA: Diagnosis not present

## 2024-01-31 DIAGNOSIS — K582 Mixed irritable bowel syndrome: Secondary | ICD-10-CM | POA: Diagnosis not present

## 2024-01-31 DIAGNOSIS — R194 Change in bowel habit: Secondary | ICD-10-CM | POA: Diagnosis not present

## 2024-01-31 LAB — SEDIMENTATION RATE: Sed Rate: 9 mm/h (ref 0–30)

## 2024-01-31 MED ORDER — NA SULFATE-K SULFATE-MG SULF 17.5-3.13-1.6 GM/177ML PO SOLN: 1.0000 | Freq: Once | ORAL | 0 refills | Status: AC

## 2024-01-31 MED ORDER — HYOSCYAMINE SULFATE 0.125 MG PO TABS: 0.2500 mg | ORAL_TABLET | Freq: Four times a day (QID) | ORAL | 2 refills | Status: AC | PRN

## 2024-01-31 NOTE — Patient Instructions (Addendum)
 _______________________________________________________  If your blood pressure at your visit was 140/90 or greater, please contact your primary care physician to follow up on this.  _______________________________________________________  If you are age 52 or older, your body mass index should be between 23-30. Your Body mass index is 25.05 kg/m. If this is out of the aforementioned range listed, please consider follow up with your Primary Care Provider.  If you are age 54 or younger, your body mass index should be between 19-25. Your Body mass index is 25.05 kg/m. If this is out of the aformentioned range listed, please consider follow up with your Primary Care Provider.   ________________________________________________________  The [facility] providers would like to encourage you to use MYCHART to communicate with providers for non-urgent requests or questions.  Due to long hold times on the telephone, sending your provider a message by [facility] may be a faster and more efficient way to get a response.  Please allow 48 business hours for a response.  Please remember that this is for non-urgent requests.  _______________________________________________________  [facility] is using a team-based approach to care.  Your team is made up of your doctor and two to three APPS. Our APPS (Nurse Practitioners and Physician Assistants) work with your physician to ensure care continuity for you. They are fully qualified to address your health concerns and develop a treatment plan. They communicate directly with your gastroenterologist to care for you. Seeing the Advanced Practice Practitioners on your physician's team can help you by facilitating care more promptly, often allowing for earlier appointments, access to diagnostic testing, procedures, and other specialty referrals.   Your provider has requested that you go to the basement level for lab work before leaving today. Press B on the  elevator. The lab is located at the first door on the left as you exit the elevator.  Your provider has requested that you have an abdominal x ray before leaving today. Please go to the basement floor to our Radiology department for the test.  We have sent the following medications to your pharmacy for you to pick up at your convenience: Suprep Levsin    First do a trial off milk/lactose products if you use them.  Add fiber like benefiber or citracel once a day Increase activity Can do trial of IBGard which is over the counter for AB pain- Take 1-2 capsules once a day for maintence or twice a day during a flare Can send in an anti spasm medication, Levsin , to take as needed    FODMAP stands for fermentable oligo-, di-, mono-saccharides and polyols (1). These are the scientific terms used to classify groups of carbs that are difficult for our body to digest and that are notorious for triggering digestive symptoms like bloating, gas, loose stools and stomach pain.   You can try low FODMAP diet  - start with eliminating just one column at a time that you feel may be a trigger for you. - the table at the very bottom contains foods that are low in FODMAPs   Sometimes trying to eliminate the FODMAP's from your diet is difficult or tricky, if you are stuggling with trying to do the elimination diet you can try an enzyme.  There is a food enzymes that you sprinkle in or on your food that helps break down the FODMAP. You can read more about the enzyme by going to this site: https://fodzyme.com/   Here some information about pelvic floor dysfunction. This may be contributing to some  of your symptoms. We will continue with our evaluation but I do want you to consider adding on fiber supplement with low-dose MiraLAX daily. We could also refer to pelvic floor physical therapy.   Pelvic Floor Dysfunction, Female Pelvic floor dysfunction (PFD) is a condition that results when the group of muscles  and connective tissues that support the organs in the pelvis (pelvic floor muscles) do not work well. These muscles and their connections form a sling that supports the colon and bladder. In women, they also support the uterus. PFD causes pelvic floor muscles to be too weak, too tight, or both. In PFD, muscle movements are not coordinated. This may cause bowel or bladder problems. It may also cause pain. What are the causes? This condition may be caused by an injury to the pelvic area or by a weakening of pelvic muscles. This often results from pregnancy and childbirth or other types of strain. In many cases, the exact cause is not known. What increases the risk? The following factors may make you more likely to develop this condition: Having chronic bladder tissue inflammation (interstitial cystitis). Being an older person. Being overweight. History of radiation treatment for cancer in the pelvic region. Previous pelvic surgery, such as removal of the uterus (hysterectomy). What are the signs or symptoms? Symptoms of this condition vary and may include: Bladder symptoms, such as: Trouble starting urination and emptying the bladder. Frequent urinary tract infections. Leaking urine when coughing, laughing, or exercising (stress incontinence). Having to pass urine urgently or frequently. Pain when passing urine. Bowel symptoms, such as: Constipation. Urgent or frequent bowel movements. Incomplete bowel movements. Painful bowel movements. Leaking stool or gas. Unexplained genital or rectal pain. Genital or rectal muscle spasms. Low back pain. Other symptoms may include: A heavy, full, or aching feeling in the vagina. A bulge that protrudes into the vagina. Pain during or after sex. How is this diagnosed? This condition may be diagnosed based on: Your symptoms and medical history. A physical exam. During the exam, your health care provider may check your pelvic muscles for tightness,  spasm, pain, or weakness. This may include a rectal exam and a pelvic exam. In some cases, you may have diagnostic tests, such as: Electrical muscle function tests. Urine flow testing. X-ray tests of bowel function. Ultrasound of the pelvic organs. How is this treated? Treatment for this condition depends on the symptoms. Treatment options include: Physical therapy. This may include Kegel exercises to help relax or strengthen the pelvic floor muscles. Biofeedback. This type of therapy provides feedback on how tight your pelvic floor muscles are so that you can learn to control them. Internal or external massage therapy. A treatment that involves electrical stimulation of the pelvic floor muscles to help control pain (transcutaneous electrical nerve stimulation, or TENS). Sound wave therapy (ultrasound) to reduce muscle spasms. Medicines, such as: Muscle relaxants. Bladder control medicines. Surgery to reconstruct or support pelvic floor muscles may be an option if other treatments do not help. Follow these instructions at home: Activity Do your usual activities as told by your health care provider. Ask your health care provider if you should modify any activities. Do pelvic floor strengthening or relaxing exercises at home as told by your physical therapist. Lifestyle Maintain a healthy weight. Eat foods that are high in fiber, such as beans, whole grains, and fresh fruits and vegetables. Limit foods that are high in fat and processed sugars, such as fried or sweet foods. Manage stress with relaxation techniques such  as yoga or meditation. General instructions If you have problems with leakage: Use absorbable pads or wear padded underwear. Wash frequently with mild soap. Keep your genital and anal area as clean and dry as possible. Ask your health care provider if you should try a barrier cream to prevent skin irritation. Take warm baths to relieve pelvic muscle tension or  spasms. Take over-the-counter and prescription medicines only as told by your health care provider. Keep all follow-up visits. How is this prevented? The cause of PFD is not always known, but there are a few things you can do to reduce the risk of developing this condition, including: Staying at a healthy weight. Getting regular exercise. Managing stress. Contact a health care provider if: Your symptoms are not improving with home care. You have signs or symptoms of PFD that get worse at home. You develop new signs or symptoms. You have signs of a urinary tract infection, such as: Fever. Chills. Increased urinary frequency. A burning feeling when urinating. You have not had a bowel movement in 3 days (constipation). Summary Pelvic floor dysfunction results when the muscles and connective tissues in your pelvic floor do not work well. These muscles and their connections form a sling that supports your colon and bladder. In women, they also support the uterus. PFD may be caused by an injury to the pelvic area or by a weakening of pelvic muscles. PFD causes pelvic floor muscles to be too weak, too tight, or a combination of both. Symptoms may vary from person to person. In most cases, PFD can be treated with physical therapies and medicines. Surgery may be an option if other treatments do not help. This information is not intended to replace advice given to you by your health care provider. Make sure you discuss any questions you have with your health care provider. Document Revised: 09/03/2020 Document Reviewed: 09/03/2020 Elsevier Patient Education  2022 [employer].

## 2024-02-02 LAB — IGA: Immunoglobulin A: 200 mg/dL (ref 47–310)

## 2024-02-02 LAB — TISSUE TRANSGLUTAMINASE, IGA: (tTG) Ab, IgA: <1 U/mL

## 2024-02-29 ENCOUNTER — Encounter: Payer: Self-pay | Admitting: Gastroenterology

## 2024-02-29 ENCOUNTER — Ambulatory Visit (AMBULATORY_SURGERY_CENTER): Admitting: Gastroenterology

## 2024-02-29 VITALS — BP 141/86 | HR 65 | Temp 97.6°F | Resp 12 | Ht 60.0 in | Wt 128.0 lb

## 2024-02-29 DIAGNOSIS — K644 Residual hemorrhoidal skin tags: Secondary | ICD-10-CM | POA: Diagnosis not present

## 2024-02-29 DIAGNOSIS — K648 Other hemorrhoids: Secondary | ICD-10-CM | POA: Diagnosis not present

## 2024-02-29 DIAGNOSIS — D120 Benign neoplasm of cecum: Secondary | ICD-10-CM

## 2024-02-29 DIAGNOSIS — K5730 Diverticulosis of large intestine without perforation or abscess without bleeding: Secondary | ICD-10-CM

## 2024-02-29 DIAGNOSIS — K635 Polyp of colon: Secondary | ICD-10-CM | POA: Diagnosis not present

## 2024-02-29 DIAGNOSIS — R1032 Left lower quadrant pain: Secondary | ICD-10-CM

## 2024-02-29 MED ORDER — SODIUM CHLORIDE 0.9 % IV SOLN: 500.0000 mL | Freq: Once | INTRAVENOUS | Status: AC

## 2024-02-29 NOTE — Progress Notes (Signed)
 Called to room to assist during endoscopic procedure.  Patient ID and intended procedure confirmed with present staff. Received instructions for my participation in the procedure from the performing physician.

## 2024-02-29 NOTE — Progress Notes (Unsigned)
 Pt's states no medical or surgical changes since previsit or office visit.

## 2024-02-29 NOTE — Patient Instructions (Signed)
 YOU HAD AN ENDOSCOPIC PROCEDURE TODAY AT THE [facility]:   Refer to the procedure report that was given to you for any specific questions about what was found during the examination.  If the procedure report does not answer your questions, please call your gastroenterologist to clarify.  If you requested that your care partner not be given the details of your procedure findings, then the procedure report has been included in a sealed envelope for you to review at your convenience later.  YOU SHOULD EXPECT: Some feelings of bloating in the abdomen. Passage of more gas than usual.  Walking can help get rid of the air that was put into your GI tract during the procedure and reduce the bloating. If you had a lower endoscopy (such as a colonoscopy or flexible sigmoidoscopy) you may notice spotting of blood in your stool or on the toilet paper. If you underwent a bowel prep for your procedure, you may not have a normal bowel movement for a few days.  Please Note:  You might notice some irritation and congestion in your nose or some drainage.  This is from the oxygen used during your procedure.  There is no need for concern and it should clear up in a day or so.  SYMPTOMS TO REPORT IMMEDIATELY:  Following lower endoscopy (colonoscopy or flexible sigmoidoscopy):  Excessive amounts of blood in the stool  Significant tenderness or worsening of abdominal pains  Swelling of the abdomen that is new, acute  Fever of 100F or higher   Resume previous diet Continue present medications Await pathology results Handouts on polyps, hemorrhoids and diverticulosis given  For urgent or emergent issues, a gastroenterologist can be reached at any hour by calling [phone]. Do not use MyChart messaging for urgent concerns.    DIET:  We do recommend a small meal at first, but then you may proceed to your regular diet.  Drink plenty of fluids but you should avoid alcoholic beverages for 24  hours.  ACTIVITY:  You should plan to take it easy for the rest of today and you should NOT DRIVE or use heavy machinery until tomorrow (because of the sedation medicines used during the test).    FOLLOW UP: Our staff will call the number listed on your records the next business day following your procedure.  We will call around 7:15- 8:00 am to check on you and address any questions or concerns that you may have regarding the information given to you following your procedure. If we do not reach you, we will leave a message.     If any biopsies were taken you will be contacted by phone or by letter within the next 1-3 weeks.  Please call us  at [phone] if you have not heard about the biopsies in 3 weeks.    SIGNATURES/CONFIDENTIALITY: You and/or your care partner have signed paperwork which will be entered into your electronic medical record.  These signatures attest to the fact that that the information above on your After Visit Summary has been reviewed and is understood.  Full responsibility of the confidentiality of this discharge information lies with you and/or your care-partner.

## 2024-02-29 NOTE — Progress Notes (Unsigned)
  Gastroenterology History and Physical   Primary Care Physician:  Shona Norleen PEDLAR, MD   Reason for Procedure:  Occasional rectal bleeding, left lower quadrant abdominal pain, change in bowel habits  Plan:    colonoscopy with possible interventions as needed     HPI: Katrina Molina is a very pleasant 53 y.o. female here for colonoscopy for evaluation of intermittent rectal bleeding, left lower quadrant abdominal pain, change in bowel habits.  The risks and benefits as well as alternatives of endoscopic procedure(s) have been discussed and reviewed. All questions answered. The patient agrees to proceed.    Past Medical History:  Diagnosis Date   Hypertension    IBS (irritable bowel syndrome)    Insomnia     Past Surgical History:  Procedure Laterality Date   CESAREAN SECTION  05/11/1999   NASAL SEPTUM SURGERY Bilateral     Prior to Admission medications   Medication Sig Start Date End Date Taking? Authorizing Provider  amLODipine  (NORVASC ) 2.5 MG tablet Take 1 tablet (2.5 mg total) by mouth daily. 11/23/18  Yes Pollina, Lonni PARAS, MD  escitalopram (LEXAPRO) 10 MG tablet Take 10 mg by mouth daily. 06/29/15  Yes [provider]  ALPRAZolam (XANAX) 0.5 MG tablet Take 0.5 mg by mouth daily as needed.    [provider]  hyoscyamine  (LEVSIN ) 0.125 MG tablet Take 2 tablets (0.25 mg total) by mouth every 6 (six) hours as needed for cramping. 01/31/24   Craig Alan SAUNDERS, PA-C  Omega-3 Fatty Acids (FISH OIL) 300 MG CAPS daily.    [provider]  zolpidem (AMBIEN) 10 MG tablet  07/09/15   [provider]    Current Outpatient Medications  Medication Sig Dispense Refill   amLODipine  (NORVASC ) 2.5 MG tablet Take 1 tablet (2.5 mg total) by mouth daily. 30 tablet 3   escitalopram (LEXAPRO) 10 MG tablet Take 10 mg by mouth daily.  11   ALPRAZolam (XANAX) 0.5 MG tablet Take 0.5 mg by mouth daily as needed.     hyoscyamine  (LEVSIN ) 0.125 MG  tablet Take 2 tablets (0.25 mg total) by mouth every 6 (six) hours as needed for cramping. 30 tablet 2   Omega-3 Fatty Acids (FISH OIL) 300 MG CAPS daily.     zolpidem (AMBIEN) 10 MG tablet      Current Facility-Administered Medications  Medication Dose Route Frequency Provider Last Rate Last Admin   0.9 %  sodium chloride infusion  500 mL Intravenous Once Allyah Heather V, MD        Allergies as of 02/29/2024 - Review Complete 02/29/2024  Allergen Reaction Noted   Aspirin Hives 09/19/2012   Ibuprofen Hives 05/28/2013   Penicillins Hives 09/19/2012    Family History  Problem Relation Age of Onset   Hypertension Mother    Heart disease Mother    Irritable bowel syndrome Mother    Colon cancer Neg Hx    Breast cancer Neg Hx    Esophageal cancer Neg Hx    Rectal cancer Neg Hx    Stomach cancer Neg Hx     Social History   Socioeconomic History   Marital status: Single    Spouse name: Not on file   Number of children: 1   Years of education: Not on file   Highest education level: Not on file  Occupational History   Not on file  Tobacco Use   Smoking status: Never   Smokeless tobacco: Never  Vaping Use   Vaping status: Never  Used  Substance and Sexual Activity   Alcohol use: Yes    Comment: 1-2 drinks daily   Drug use: No   Sexual activity: Not on file  Other Topics Concern   Not on file  Social History Narrative   Not on file   Social Drivers of Health   Financial Resource Strain: Not on file  Food Insecurity: Not on file  Transportation Needs: Not on file  Physical Activity: Not on file  Stress: Not on file  Social Connections: Not on file  Intimate Partner Violence: Not on file    Review of Systems:  All other review of systems negative except as mentioned in the HPI.  Physical Exam: Vital signs in last 24 hours: BP (!) 142/88   Pulse 70   Temp 97.6 F (36.4 C)   Ht 5' (1.524 m)   Wt 128 lb (58.1 kg)   LMP 11/23/2018 (Exact Date)   SpO2 98%    BMI 25.00 kg/m  General:   Alert, NAD Lungs:  Clear .   Heart:  Regular rate and rhythm Abdomen:  Soft, nontender and nondistended. Neuro/Psych:  Alert and cooperative. Normal mood and affect. A and O x 3  Reviewed labs, radiology imaging, old records and pertinent past GI work up  Patient is appropriate for planned procedure(s) and anesthesia in an ambulatory setting   [name] , MD [phone]

## 2024-02-29 NOTE — Op Note (Signed)
 [facility] Patient Name: Katrina Molina Procedure Date: 02/29/2024 10:32 AM MRN: [id number] Endoscopist: Gustav ALONSO Mcgee , MD, [id number] Age: 53 Referring MD:  Date of Birth: Nov 27, 1970 Gender: Female Account #: [id number] Procedure:                Colonoscopy Indications:              Abdominal pain in the left lower quadrant, Change                            in bowel habits, occasional rectal bleeding Medicines:                Monitored Anesthesia Care Procedure:                Pre-Anesthesia Assessment:                           - Prior to the procedure, a History and Physical                            was performed, and patient medications and                            allergies were reviewed. The patient's tolerance of                            previous anesthesia was also reviewed. The risks                            and benefits of the procedure and the sedation                            options and risks were discussed with the patient.                            All questions were answered, and informed consent                            was obtained. Prior Anticoagulants: The patient has                            taken no anticoagulant or antiplatelet agents. ASA                            Grade Assessment: II - A patient with mild systemic                            disease. After reviewing the risks and benefits,                            the patient was deemed in satisfactory condition to                            undergo the procedure.  After obtaining informed consent, the colonoscope                            was passed under direct vision. Throughout the                            procedure, the patient's blood pressure, pulse, and                            oxygen saturations were monitored continuously. The                            Olympus Scope [id number] was introduced through the                            anus and  advanced to the the cecum, identified by                            appendiceal orifice and ileocecal valve. The                            colonoscopy was performed without difficulty. The                            patient tolerated the procedure well. The quality                            of the bowel preparation was good. The ileocecal                            valve, appendiceal orifice, and rectum were                            photographed. Scope In: 10:45:40 AM Scope Out: 10:57:06 AM Scope Withdrawal Time: 0 hours 7 minutes 31 seconds  Total Procedure Duration: 0 hours 11 minutes 26 seconds  Findings:                 The perianal and digital rectal examinations were                            normal.                           Two sessile polyps were found in the cecum. The                            polyps were 3 to 4 mm in size. These polyps were                            removed with a cold snare. Resection and retrieval                            were complete.  Scattered Bartolotta-mouthed diverticula were found in                            the sigmoid colon, descending colon and ascending                            colon.                           Non-bleeding external and internal hemorrhoids were                            found during retroflexion. The hemorrhoids were                            Bernards. Complications:            No immediate complications. Estimated Blood Loss:     Estimated blood loss was minimal. Impression:               - Two 3 to 4 mm polyps in the cecum, removed with a                            cold snare. Resected and retrieved.                           - Diverticulosis in the sigmoid colon, in the                            descending colon and in the ascending colon.                           - Non-bleeding external and internal hemorrhoids. Recommendation:           - Resume previous diet.                           -  Continue present medications.                           - Await pathology results.                           - Repeat colonoscopy in 5-10 years for surveillance                            based on pathology results. Jillaine Waren V. Jules Baty, MD 02/29/2024 11:02:25 AM This report has been signed electronically.

## 2024-02-29 NOTE — Progress Notes (Unsigned)
 Sedate, gd SR, tolerated procedure well, VSS, report to RN

## 2024-03-01 ENCOUNTER — Telehealth: Payer: Self-pay

## 2024-03-01 NOTE — Telephone Encounter (Signed)
 No answer, left message to call if having any issues or concerns, B.Vester Titsworth RN

## 2024-03-02 LAB — SURGICAL PATHOLOGY

## 2024-03-07 ENCOUNTER — Ambulatory Visit: Payer: Self-pay | Admitting: Gastroenterology

## 2024-05-07 ENCOUNTER — Ambulatory Visit: Admission: EM | Admit: 2024-05-07 | Discharge: 2024-05-07 | Disposition: A | Discharge status: Home / Self Care

## 2024-05-07 DIAGNOSIS — R059 Cough, unspecified: Secondary | ICD-10-CM | POA: Diagnosis not present

## 2024-05-07 DIAGNOSIS — B9689 Other specified bacterial agents as the cause of diseases classified elsewhere: Secondary | ICD-10-CM | POA: Diagnosis not present

## 2024-05-07 DIAGNOSIS — J069 Acute upper respiratory infection, unspecified: Secondary | ICD-10-CM

## 2024-05-07 MED ORDER — FLUTICASONE PROPIONATE 50 MCG/ACT NA SUSP: 2.0000 | Freq: Every day | NASAL | 0 refills | Status: AC

## 2024-05-07 MED ORDER — AZITHROMYCIN 250 MG PO TABS: 250.0000 mg | ORAL_TABLET | Freq: Every day | ORAL | 0 refills | Status: AC

## 2024-05-07 NOTE — ED Triage Notes (Signed)
 Sinus pain and pressure, cough x 1 week. Tried nasal rinses with no relief of symptoms.

## 2024-05-07 NOTE — Discharge Instructions (Addendum)
 Take medication as directed. Continue your current cough medication regimen.. Increase fluids and get plenty of rest. May take over-the-counter Tylenol as needed for pain, fever, or general discomfort. Recommend normal saline nasal spray to help with nasal congestion throughout the day. For your cough, it may be helpful to use a humidifier at bedtime during sleep. If your symptoms fail to improve with this treatment, you may follow-up in this clinic or with your primary care physician or ENT for further evaluation. Follow-up as needed.

## 2024-05-07 NOTE — ED Provider Notes (Signed)
 " [facility]    CSN: [id number] Arrival date & time: 05/07/24  0915      History   Chief Complaint Chief Complaint  Patient presents with   Facial Pain    HPI Katrina Molina is a 53 y.o. female.   The history is provided by the patient.   Patient presents with a 1 week history of postnasal drainage, nasal congestion, cough, sinus pain and pressure.  She reports she had a fever last evening between 0100-0101.  Denies headache, ear pain, ear drainage, wheezing, difficulty breathing, chest pain, abdominal pain, nausea, vomiting, diarrhea, or rash.  Patient reports history of hypertrophy of nasal turbinates and chronic rhinitis.  States she has been using over-the-counter cough and cold medications along with nasal rinses with minimal relief of her symptoms.   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      Home Medications    Prior to Admission medications  Medication Sig Start Date End Date Taking? Authorizing Provider  amLODipine  (NORVASC ) 2.5 MG tablet Take 1 tablet (2.5 mg total) by mouth daily. 11/23/18  Yes Pollina, Lonni PARAS, MD  azithromycin (ZITHROMAX) 250 MG tablet Take 1 tablet (250 mg total) by mouth daily. Take first 2 tablets together, then 1 every day until finished. 05/07/24  Yes Leath-Warren, Etta PARAS, NP  escitalopram (LEXAPRO) 10 MG tablet Take 10 mg by mouth daily. 06/29/15  Yes [provider]  fluticasone (FLONASE) 50 MCG/ACT nasal spray Place 2 sprays into  both nostrils daily. 05/07/24  Yes Leath-Warren, Etta PARAS, NP  hydrochlorothiazide (HYDRODIURIL) 25 MG tablet Take 25 mg by mouth daily. 03/03/24  Yes [provider]  Omega-3 Fatty Acids (FISH OIL) 300 MG CAPS daily.   Yes [provider]  zolpidem (AMBIEN) 10 MG tablet  07/09/15  Yes [provider]  ALPRAZolam (XANAX) 0.5 MG tablet Take 0.5 mg by mouth daily as needed.    [provider]  hyoscyamine  (LEVSIN ) 0.125 MG tablet Take 2 tablets (0.25 mg total) by mouth every 6 (six) hours as needed for cramping. 01/31/24   Craig Alan SAUNDERS, PA-C    Family History Family History  Problem Relation Age of Onset   Hypertension Mother    Heart disease Mother    Irritable bowel syndrome Mother    Colon cancer Neg Hx    Breast cancer Neg Hx    Esophageal cancer Neg Hx    Rectal cancer Neg Hx    Stomach cancer Neg Hx     Social History Social History[1]   Allergies   Aspirin, Ibuprofen, and Penicillins   Review of Systems Review of Systems Per HPI  Physical Exam Triage Vital Signs ED Triage Vitals  Encounter Vitals Group     BP 05/07/24 1054 (!) 152/90     Girls Systolic BP Percentile --      Girls Diastolic BP Percentile --      Boys Systolic BP Percentile --  Boys Diastolic BP Percentile --      Pulse Rate 05/07/24 1054 74     Resp 05/07/24 1054 16     Temp 05/07/24 1054 98.9 F (37.2 C)     Temp Source 05/07/24 1054 Oral     SpO2 05/07/24 1054 96 %     Weight --      Height --      Head Circumference --      Peak Flow --      Pain Score 05/07/24 1057 0     Pain Loc --      Pain Education --      Exclude from Growth Chart --    No data found.  Updated Vital Signs BP (!) 152/90 (BP Location: Right Arm)   Pulse 74   Temp 98.9 F (37.2 C) (Oral)   Resp 16   LMP 11/23/2018   SpO2 96%   Visual Acuity Right Eye Distance:   Left Eye Distance:   Bilateral Distance:    Right Eye Near:   Left Eye Near:    Bilateral  Near:     Physical Exam Vitals and nursing note reviewed.  Constitutional:      General: She is not in acute distress.    Appearance: Normal appearance.  HENT:     Head: Normocephalic.     Right Ear: Tympanic membrane, ear canal and external ear normal.     Left Ear: Tympanic membrane, ear canal and external ear normal.     Nose: Congestion present.     Mouth/Throat:     Mouth: Mucous membranes are moist.  Eyes:     Extraocular Movements: Extraocular movements intact.     Conjunctiva/sclera: Conjunctivae normal.     Pupils: Pupils are equal, round, and reactive to light.  Cardiovascular:     Rate and Rhythm: Normal rate and regular rhythm.     Pulses: Normal pulses.     Heart sounds: Normal heart sounds.  Pulmonary:     Effort: Pulmonary effort is normal. No respiratory distress.     Breath sounds: Normal breath sounds. No stridor. No wheezing, rhonchi or rales.  Abdominal:     General: Bowel sounds are normal.     Palpations: Abdomen is soft.  Musculoskeletal:     Cervical back: Normal range of motion.  Lymphadenopathy:     Cervical: No cervical adenopathy.  Skin:    General: Skin is warm.  Neurological:     General: No focal deficit present.     Mental Status: She is alert and oriented to person, place, and time.  Psychiatric:        Mood and Affect: Mood normal.        Behavior: Behavior normal.      UC Treatments / Results  Labs (all labs ordered are listed, but only abnormal results are displayed) Labs Reviewed - No data to display  EKG   Radiology No results found.  Procedures Procedures (including critical care time)  Medications Ordered in UC Medications - No data to display  Initial Impression / Assessment and Plan / UC Course  I have reviewed the triage vital signs and the nursing notes.  Pertinent labs & imaging results that were available during my care of the patient were reviewed by me and considered in my medical decision making (see  chart for details).  On exam, the patient's lung sounds are clear throughout, room air sats are at 96%.  She does not exhibit any  sinus pressure or tenderness noted on exam,; however, given her history of chronic rhinitis and hypertrophy of nasal turbinates, will cover for possible bacterial etiology for a URI.  Will treat with azithromycin 250 mg and fluticasone 50 mcg nasal spray.  Supportive care recommendations were provided and discussed with the patient to include fluids, rest, over-the-counter analgesics, use of normal saline nasal spray, and use of a humidifier during sleep.  Discussed indications with the patient regarding follow-up.  Patient was in agreement with this plan of care and verbalizes understanding.  All questions were answered.  Patient stable for discharge.   Final Clinical Impressions(s) / UC Diagnoses   Final diagnoses:  Bacterial upper respiratory infection  Cough, unspecified type     Discharge Instructions      Take medication as directed. Continue your current cough medication regimen.. Increase fluids and get plenty of rest. May take over-the-counter Tylenol as needed for pain, fever, or general discomfort. Recommend normal saline nasal spray to help with nasal congestion throughout the day. For your cough, it may be helpful to use a humidifier at bedtime during sleep. If your symptoms fail to improve with this treatment, you may follow-up in this clinic or with your primary care physician or ENT for further evaluation. Follow-up as needed.     ED Prescriptions     Medication Sig Dispense Auth. Provider   azithromycin (ZITHROMAX) 250 MG tablet Take 1 tablet (250 mg total) by mouth daily. Take first 2 tablets together, then 1 every day until finished. 6 tablet Leath-Warren, Etta PARAS, NP   fluticasone (FLONASE) 50 MCG/ACT nasal spray Place 2 sprays into both nostrils daily. 16 g Leath-Warren, Etta PARAS, NP      PDMP not reviewed this encounter.      [1]  Social History Tobacco Use   Smoking status: Never   Smokeless tobacco: Never  Vaping Use   Vaping status: Never Used  Substance Use Topics   Alcohol use: Yes    Comment: 1-2 drinks daily   Drug use: No     Gilmer Etta PARAS, NP 05/07/24 1111  "

## 2024-05-17 ENCOUNTER — Other Ambulatory Visit: Payer: Self-pay | Admitting: Obstetrics and Gynecology

## 2024-05-17 DIAGNOSIS — R928 Other abnormal and inconclusive findings on diagnostic imaging of breast: Secondary | ICD-10-CM

## 2024-05-23 ENCOUNTER — Inpatient Hospital Stay
Admission: RE | Admit: 2024-05-23 | Discharge: 2024-05-23 | Attending: Obstetrics and Gynecology | Admitting: Obstetrics and Gynecology

## 2024-05-23 ENCOUNTER — Other Ambulatory Visit

## 2024-05-23 DIAGNOSIS — R928 Other abnormal and inconclusive findings on diagnostic imaging of breast: Secondary | ICD-10-CM
